# Patient Record
Sex: Female | Born: 1965 | Race: Black or African American | Hispanic: No | Smoking: Former smoker
Health system: Southern US, Community
[De-identification: ages and names within clinical notes are randomized; demographics above are authoritative.]

## PROBLEM LIST (undated history)

## (undated) DIAGNOSIS — M1990 Unspecified osteoarthritis, unspecified site: Secondary | ICD-10-CM

## (undated) DIAGNOSIS — G8929 Other chronic pain: Secondary | ICD-10-CM

## (undated) DIAGNOSIS — R519 Headache, unspecified: Secondary | ICD-10-CM

## (undated) DIAGNOSIS — F329 Major depressive disorder, single episode, unspecified: Secondary | ICD-10-CM

## (undated) DIAGNOSIS — F419 Anxiety disorder, unspecified: Secondary | ICD-10-CM

## (undated) DIAGNOSIS — E079 Disorder of thyroid, unspecified: Secondary | ICD-10-CM

## (undated) DIAGNOSIS — I10 Essential (primary) hypertension: Secondary | ICD-10-CM

## (undated) DIAGNOSIS — M545 Low back pain, unspecified: Secondary | ICD-10-CM

## (undated) DIAGNOSIS — G43909 Migraine, unspecified, not intractable, without status migrainosus: Secondary | ICD-10-CM

## (undated) DIAGNOSIS — M797 Fibromyalgia: Secondary | ICD-10-CM

## (undated) DIAGNOSIS — K219 Gastro-esophageal reflux disease without esophagitis: Secondary | ICD-10-CM

## (undated) DIAGNOSIS — E7800 Pure hypercholesterolemia, unspecified: Secondary | ICD-10-CM

## (undated) DIAGNOSIS — K589 Irritable bowel syndrome without diarrhea: Secondary | ICD-10-CM

## (undated) DIAGNOSIS — F32A Depression, unspecified: Secondary | ICD-10-CM

## (undated) HISTORY — DX: Essential (primary) hypertension: I10

## (undated) HISTORY — DX: Headache, unspecified: R51.9

## (undated) HISTORY — DX: Other chronic pain: G89.29

## (undated) HISTORY — DX: Low back pain: M54.5

## (undated) HISTORY — DX: Low back pain, unspecified: M54.50

## (undated) HISTORY — DX: Irritable bowel syndrome, unspecified: K58.9

## (undated) HISTORY — DX: Migraine, unspecified, not intractable, without status migrainosus: G43.909

## (undated) HISTORY — DX: Unspecified osteoarthritis, unspecified site: M19.90

## (undated) HISTORY — DX: Disorder of thyroid, unspecified: E07.9

---
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
## 2005-02-28 HISTORY — PX: ESOPHAGOGASTRODUODENOSCOPY: SHX1529

## 2006-05-30 HISTORY — PX: COLONOSCOPY: SHX174

## 2008-02-29 HISTORY — PX: CHOLECYSTECTOMY: SHX55

## 2009-06-08 DIAGNOSIS — F419 Anxiety disorder, unspecified: Secondary | ICD-10-CM | POA: Insufficient documentation

## 2009-06-08 DIAGNOSIS — E559 Vitamin D deficiency, unspecified: Secondary | ICD-10-CM | POA: Insufficient documentation

## 2009-10-29 DIAGNOSIS — L0390 Cellulitis, unspecified: Secondary | ICD-10-CM | POA: Insufficient documentation

## 2009-10-29 HISTORY — DX: Cellulitis, unspecified: L03.90

## 2010-04-29 ENCOUNTER — Ambulatory Visit: Payer: Self-pay | Admitting: Family Medicine

## 2010-09-29 HISTORY — PX: ESOPHAGOGASTRODUODENOSCOPY: SHX1529

## 2010-10-14 DIAGNOSIS — K449 Diaphragmatic hernia without obstruction or gangrene: Secondary | ICD-10-CM | POA: Insufficient documentation

## 2010-10-14 HISTORY — DX: Diaphragmatic hernia without obstruction or gangrene: K44.9

## 2011-04-25 ENCOUNTER — Other Ambulatory Visit: Payer: Self-pay | Admitting: Internal Medicine

## 2011-04-25 DIAGNOSIS — M545 Low back pain: Secondary | ICD-10-CM

## 2011-05-01 ENCOUNTER — Ambulatory Visit
Admission: RE | Admit: 2011-05-01 | Discharge: 2011-05-01 | Disposition: A | Discharge status: Home / Self Care | Payer: BC Managed Care – HMO | Source: Ambulatory Visit | Attending: Internal Medicine | Admitting: Internal Medicine

## 2011-05-01 DIAGNOSIS — M545 Low back pain: Secondary | ICD-10-CM

## 2011-10-14 DIAGNOSIS — K589 Irritable bowel syndrome without diarrhea: Secondary | ICD-10-CM | POA: Insufficient documentation

## 2012-02-29 HISTORY — PX: COLONOSCOPY: SHX174

## 2012-03-11 ENCOUNTER — Emergency Department (HOSPITAL_COMMUNITY)
Admission: EM | Admit: 2012-03-11 | Discharge: 2012-03-11 | Disposition: A | Discharge status: Home / Self Care | Payer: BC Managed Care – PPO | Attending: Emergency Medicine | Admitting: Emergency Medicine

## 2012-03-11 ENCOUNTER — Encounter (HOSPITAL_COMMUNITY): Payer: Self-pay | Admitting: *Deleted

## 2012-03-11 ENCOUNTER — Emergency Department (HOSPITAL_COMMUNITY): Payer: BC Managed Care – PPO

## 2012-03-11 DIAGNOSIS — F3289 Other specified depressive episodes: Secondary | ICD-10-CM | POA: Insufficient documentation

## 2012-03-11 DIAGNOSIS — Z79899 Other long term (current) drug therapy: Secondary | ICD-10-CM | POA: Insufficient documentation

## 2012-03-11 DIAGNOSIS — E7800 Pure hypercholesterolemia, unspecified: Secondary | ICD-10-CM | POA: Insufficient documentation

## 2012-03-11 DIAGNOSIS — R0789 Other chest pain: Secondary | ICD-10-CM

## 2012-03-11 DIAGNOSIS — F411 Generalized anxiety disorder: Secondary | ICD-10-CM | POA: Insufficient documentation

## 2012-03-11 DIAGNOSIS — K219 Gastro-esophageal reflux disease without esophagitis: Secondary | ICD-10-CM | POA: Insufficient documentation

## 2012-03-11 DIAGNOSIS — F329 Major depressive disorder, single episode, unspecified: Secondary | ICD-10-CM | POA: Insufficient documentation

## 2012-03-11 DIAGNOSIS — IMO0001 Reserved for inherently not codable concepts without codable children: Secondary | ICD-10-CM | POA: Insufficient documentation

## 2012-03-11 DIAGNOSIS — R071 Chest pain on breathing: Secondary | ICD-10-CM | POA: Insufficient documentation

## 2012-03-11 DIAGNOSIS — R42 Dizziness and giddiness: Secondary | ICD-10-CM | POA: Insufficient documentation

## 2012-03-11 HISTORY — DX: Depression, unspecified: F32.A

## 2012-03-11 HISTORY — DX: Gastro-esophageal reflux disease without esophagitis: K21.9

## 2012-03-11 HISTORY — DX: Anxiety disorder, unspecified: F41.9

## 2012-03-11 HISTORY — DX: Pure hypercholesterolemia, unspecified: E78.00

## 2012-03-11 HISTORY — DX: Fibromyalgia: M79.7

## 2012-03-11 HISTORY — DX: Major depressive disorder, single episode, unspecified: F32.9

## 2012-03-11 LAB — CBC
MCHC: 34 g/dL (ref 30.0–36.0)
Platelets: 304 10*3/uL (ref 150–400)
RDW: 12.7 % (ref 11.5–15.5)
WBC: 5.4 10*3/uL (ref 4.0–10.5)

## 2012-03-11 LAB — BASIC METABOLIC PANEL
Chloride: 107 mEq/L (ref 96–112)
Creatinine, Ser: 0.78 mg/dL (ref 0.50–1.10)
GFR calc Af Amer: >90 mL/min (ref >90)
GFR calc non Af Amer: >90 mL/min (ref >90)
Potassium: 3.7 mEq/L (ref 3.5–5.1)

## 2012-03-11 LAB — POCT I-STAT TROPONIN I: Troponin i, poc: 0 ng/mL (ref 0.00–0.08)

## 2012-03-11 NOTE — ED Notes (Signed)
Pt verbalized understanding of importance to follow up for outpatient stress test.

## 2012-03-11 NOTE — ED Provider Notes (Signed)
History     CSN: [id number]  Arrival date & time 03/11/12  1453   First MD Initiated Contact with Patient 03/11/12 1608      Chief Complaint  Patient presents with  . Chest Pain    (Consider location/radiation/quality/duration/timing/severity/associated sxs/prior treatment) HPI Comments: Patient presents with intermittent chest pain for the past 3-4 weeks.  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  She reports that the pain is not associated with any other type of exertion.  The pain came on again last evening while at work.  The pain has been constant since that time.  Patient has seen her PCP for this pain.  PCP thought that the pain was musculoskeletal.  She reports that she has been having mild shortness of breath associated with the pain.  She denies nausea, vomiting, or diaphoresis.  She does not have a prior cardiac history.  She does have a history of "pre diabetes", Hyperlipidemia, and is overweight.  No history of HTN.  No FH of early cardiac disease.  She reports that she has never had a stress test or an Echocardiogram.  She denies history of DVT/PE, lower extremity edema or pain, denies prolonged travel or surgeries in the past 4 weeks.  She is currently not on any estrogen containing medications.  No prior history of Cancer.  The history is provided by the patient.    Past Medical History  Diagnosis Date  . Fibromyalgia   . High cholesterol   . Acid reflux   . Depression   . Anxiety     History reviewed. No pertinent past surgical history.  History reviewed. No pertinent family history.  History  Substance Use Topics  . Smoking status: Not on file  . Smokeless tobacco: Not on file  . Alcohol Use: No    OB History    Grav Para Term Preterm Abortions TAB SAB Ect Mult Living                  Review of Systems  Constitutional: Negative for fever, chills and diaphoresis.  HENT: Negative for neck pain and neck  stiffness.   Respiratory: Negative for cough, shortness of breath and wheezing.   Cardiovascular: Positive for chest pain. Negative for palpitations and leg swelling.  Gastrointestinal: Negative for nausea, vomiting and abdominal pain.  Skin: Negative for rash.  Neurological: Positive for light-headedness. Negative for syncope, weakness and headaches.  All other systems reviewed and are negative.    Allergies  Macrodantin and Latex  Home Medications   Current Outpatient Rx  Name  Route  Sig  Dispense  Refill  . AMITRIPTYLINE HCL 25 MG PO TABS   Oral   Take 25 mg by mouth at bedtime.         Marland Kitchen ESCITALOPRAM OXALATE 5 MG PO TABS   Oral   Take 5 mg by mouth daily.         Marland Kitchen HYDROCODONE-ACETAMINOPHEN 5-325 MG PO TABS   Oral   Take 1 tablet by mouth every 4 (four) hours as needed. For pain         . TRAMADOL HCL 50 MG PO TABS   Oral   Take 50 mg by mouth 3 (three) times daily as needed. For pain         . VITAMIN D (ERGOCALCIFEROL) 50000 UNITS PO CAPS   Oral   Take 50,000 Units by mouth every 7 (seven) days. Does not  take on a specific day of the week           BP 139/92  Pulse 76  Temp 98.7 F (37.1 C) (Oral)  Resp 18  SpO2 100%  Physical Exam  Nursing note and vitals reviewed. Constitutional: She appears well-developed and well-nourished. No distress.  HENT:  Head: Normocephalic and atraumatic.  Mouth/Throat: Oropharynx is clear and moist.  Neck: Normal range of motion. Neck supple.  Cardiovascular: Normal rate, regular rhythm, normal heart sounds and intact distal pulses.   No murmur heard. Pulmonary/Chest: Effort normal and breath sounds normal. No respiratory distress. She has no wheezes. She has no rales. She exhibits tenderness.       Chest wall tender to palpation  Abdominal: Soft. There is no tenderness.  Musculoskeletal: Normal range of motion. She exhibits no edema.       No LE edema Negative Homan's sign bilaterally  Neurological: She is  alert.  Skin: Skin is warm and dry. No rash noted. She is not diaphoretic.  Psychiatric: She has a normal mood and affect.    ED Course  Procedures (including critical care time)  Labs Reviewed  BASIC METABOLIC PANEL - Abnormal; Notable for the following:    Glucose, Bld 103 (*)     All other components within normal limits  CBC  POCT I-STAT TROPONIN I   Dg Chest 2 View  03/11/2012  *RADIOLOGY REPORT*  Clinical Data: Left-sided chest pain.  Shortness of breath.  CHEST - 2 VIEW  Comparison:  None.  Findings:  The heart size and mediastinal contours are within normal limits.  Both lungs are clear.  The visualized skeletal structures are unremarkable.  IMPRESSION: No active cardiopulmonary disease.   Original Report Authenticated By: Myles Rosenthal, M.D.      No diagnosis found.    Date: 03/12/2012  Rate: 77  Rhythm: normal sinus rhythm and sinus arrhythmia  QRS Axis: left  Intervals: normal  ST/T Wave abnormalities: nonspecific T wave changes  Conduction Disutrbances:none  Narrative Interpretation:   Old EKG Reviewed: none available   Patient discussed with Dr. Denton Lank.  MDM  Patient is to be discharged with recommendation to follow up with PCP in regards to today's hospital visit. Patient also given referral to Cardiology to obtain an outpatient stress test.  Chest pain is not likely of cardiac or pulmonary etiology d/t presentation, PERC negative, VSS, Heart  RRR, Lungs clear to ausculation bilaterally, EKG without ischemic changes, negative troponin, and negative CXR. Chest wall tender to palpation.  Suspect that the pain is musculoskeletal.  Return precautions discussed with patient.  Patient in agreement with the plan.  Case has been discussed with and seen by Dr. Denton Lank who agrees with the above plan to discharge.         Pascal Lux [place], PA-C 03/12/12 1726

## 2012-03-11 NOTE — ED Notes (Signed)
Pt reports having left side chest and shoulder pain intermittent x 2 weeks. Having tingling to bilateral hands. Reports going to pcp for it and had ekg done and given shot for arthritis but no relief. Now also having increase in fatigue and dizziness, ekg done at triage.

## 2012-03-13 ENCOUNTER — Other Ambulatory Visit (HOSPITAL_COMMUNITY): Payer: Self-pay | Admitting: Cardiology

## 2012-03-13 DIAGNOSIS — R079 Chest pain, unspecified: Secondary | ICD-10-CM

## 2012-03-13 NOTE — ED Provider Notes (Signed)
Medical screening examination/treatment/procedure(s) were performed by non-physician practitioner and as supervising physician I was immediately available for consultation/collaboration.   Suzi Roots, MD 03/13/12 [phone]

## 2012-03-23 ENCOUNTER — Encounter (HOSPITAL_COMMUNITY): Payer: BC Managed Care – PPO

## 2012-04-02 ENCOUNTER — Other Ambulatory Visit: Payer: Self-pay

## 2012-04-02 ENCOUNTER — Encounter (HOSPITAL_COMMUNITY)
Admission: RE | Admit: 2012-04-02 | Discharge: 2012-04-02 | Disposition: A | Discharge status: Home / Self Care | Payer: BC Managed Care – PPO | Source: Ambulatory Visit | Attending: Cardiology | Admitting: Cardiology

## 2012-04-02 DIAGNOSIS — R079 Chest pain, unspecified: Secondary | ICD-10-CM | POA: Insufficient documentation

## 2012-04-02 MED ORDER — TECHNETIUM TC 99M SESTAMIBI GENERIC - CARDIOLITE
10.0000 | Freq: Once | INTRAVENOUS | Status: AC | PRN
  Administered 2012-04-02: 10 via INTRAVENOUS

## 2012-04-02 MED ORDER — REGADENOSON 0.4 MG/5ML IV SOLN
0.4000 mg | Freq: Once | INTRAVENOUS | Status: AC
  Administered 2012-04-02: 0.4 mg via INTRAVENOUS

## 2012-04-02 MED ORDER — REGADENOSON 0.4 MG/5ML IV SOLN: 0.4000 mg | Freq: Once | INTRAVENOUS | Status: DC

## 2012-04-02 MED ORDER — REGADENOSON 0.4 MG/5ML IV SOLN
INTRAVENOUS | Status: AC
  Administered 2012-04-02: 0.4 mg via INTRAVENOUS
  Filled 2012-04-02: qty 5

## 2012-04-02 MED ORDER — TECHNETIUM TC 99M SESTAMIBI GENERIC - CARDIOLITE
30.0000 | Freq: Once | INTRAVENOUS | Status: AC | PRN
  Administered 2012-04-02: 30 via INTRAVENOUS

## 2012-10-23 ENCOUNTER — Other Ambulatory Visit: Payer: Self-pay | Admitting: Internal Medicine

## 2012-10-23 DIAGNOSIS — M545 Low back pain: Secondary | ICD-10-CM

## 2012-10-23 DIAGNOSIS — S39012A Strain of muscle, fascia and tendon of lower back, initial encounter: Secondary | ICD-10-CM

## 2012-10-31 ENCOUNTER — Ambulatory Visit
Admission: RE | Admit: 2012-10-31 | Discharge: 2012-10-31 | Disposition: A | Discharge status: Home / Self Care | Payer: BC Managed Care – PPO | Source: Ambulatory Visit | Attending: Internal Medicine | Admitting: Internal Medicine

## 2012-10-31 DIAGNOSIS — M545 Low back pain: Secondary | ICD-10-CM

## 2012-10-31 DIAGNOSIS — S39012A Strain of muscle, fascia and tendon of lower back, initial encounter: Secondary | ICD-10-CM

## 2012-12-19 ENCOUNTER — Other Ambulatory Visit: Payer: Self-pay | Admitting: Obstetrics and Gynecology

## 2012-12-19 DIAGNOSIS — R102 Pelvic and perineal pain: Secondary | ICD-10-CM

## 2012-12-21 ENCOUNTER — Ambulatory Visit
Admission: RE | Admit: 2012-12-21 | Discharge: 2012-12-21 | Disposition: A | Discharge status: Home / Self Care | Payer: BC Managed Care – PPO | Source: Ambulatory Visit | Attending: Obstetrics and Gynecology | Admitting: Obstetrics and Gynecology

## 2012-12-21 DIAGNOSIS — R102 Pelvic and perineal pain: Secondary | ICD-10-CM

## 2012-12-21 MED ORDER — GADOBENATE DIMEGLUMINE 529 MG/ML IV SOLN
20.0000 mL | Freq: Once | INTRAVENOUS | Status: AC | PRN
  Administered 2012-12-21: 20 mL via INTRAVENOUS

## 2013-11-08 HISTORY — PX: ESOPHAGOGASTRODUODENOSCOPY: SHX1529

## 2014-07-10 DIAGNOSIS — Z9049 Acquired absence of other specified parts of digestive tract: Secondary | ICD-10-CM

## 2014-07-10 HISTORY — DX: Acquired absence of other specified parts of digestive tract: Z90.49

## 2014-11-10 ENCOUNTER — Other Ambulatory Visit: Payer: Self-pay | Admitting: Neurosurgery

## 2014-11-10 DIAGNOSIS — M5416 Radiculopathy, lumbar region: Secondary | ICD-10-CM

## 2014-12-06 ENCOUNTER — Other Ambulatory Visit: Payer: Self-pay

## 2014-12-11 ENCOUNTER — Ambulatory Visit
Admission: RE | Admit: 2014-12-11 | Discharge: 2014-12-11 | Disposition: A | Discharge status: Home / Self Care | Payer: BLUE CROSS/BLUE SHIELD | Source: Ambulatory Visit | Attending: Neurosurgery | Admitting: Neurosurgery

## 2014-12-11 DIAGNOSIS — M5416 Radiculopathy, lumbar region: Secondary | ICD-10-CM

## 2015-06-02 DIAGNOSIS — E785 Hyperlipidemia, unspecified: Secondary | ICD-10-CM | POA: Insufficient documentation

## 2015-06-02 DIAGNOSIS — R7303 Prediabetes: Secondary | ICD-10-CM | POA: Insufficient documentation

## 2016-01-01 ENCOUNTER — Telehealth: Payer: Self-pay | Admitting: Rheumatology

## 2016-01-01 NOTE — Telephone Encounter (Signed)
Patient would like to know the status of FMLA paperwork that was faxed over last week.

## 2016-01-01 NOTE — Telephone Encounter (Signed)
It is in my box, I will work on disability forms when I have completed all the medication requests in my box will you let her know I am behind if she calls back

## 2016-05-16 ENCOUNTER — Encounter: Payer: Self-pay | Admitting: Rheumatology

## 2016-05-16 ENCOUNTER — Ambulatory Visit (INDEPENDENT_AMBULATORY_CARE_PROVIDER_SITE_OTHER): Payer: BLUE CROSS/BLUE SHIELD | Admitting: Rheumatology

## 2016-05-16 VITALS — BP 130/80 | HR 88 | Resp 14 | Ht 63.0 in | Wt 214.0 lb

## 2016-05-16 DIAGNOSIS — R5382 Chronic fatigue, unspecified: Secondary | ICD-10-CM | POA: Diagnosis not present

## 2016-05-16 DIAGNOSIS — M797 Fibromyalgia: Secondary | ICD-10-CM | POA: Diagnosis not present

## 2016-05-16 DIAGNOSIS — F5101 Primary insomnia: Secondary | ICD-10-CM

## 2016-05-16 DIAGNOSIS — M62838 Other muscle spasm: Secondary | ICD-10-CM

## 2016-05-16 NOTE — Progress Notes (Signed)
Office Visit Note  Patient: Sandy Waters             Date of Birth: [birth year]           MRN: [id number]             PCP: HAMA AMIN, ALI M., MD Referring: Babs Bertin, Odette Fraction., MD Visit Date: 05/16/2016 Occupation: @GUAROCC @    Subjective:  Pain of the Right Shoulder; Pain of the Left Shoulder; Pain of the Left Hand; Pain of the Right Hand; Pain of the Neck; and Follow-up   History of Present Illness: Sandy Waters is a 51 y.o. female  Last seen 11/26/2015  Patient is having a flare for fibromyalgia. Specifically bilateral trapezius muscles are painful. Her last injection was on 11/26/2015 and she did really well until recently.  She has ongoing fatigue and insomnia.  She also complains of having bilateral hands and feet with some amount of swelling. She also states that when she elevates her legs her swelling decreases in her feet. I described that she's probably having peripheral edema which she will need to discuss with her PCP and she is agreeable.  Her swelling of her hands is more consistent with osteoarthritis. We discussed OA supplements which the patient is ready to try.   Activities of Daily Living:  Patient reports morning stiffness for 30 minutes.   Patient Denies nocturnal pain.  Difficulty dressing/grooming: Denies Difficulty climbing stairs: Denies Difficulty getting out of chair: Denies Difficulty using hands for taps, buttons, cutlery, and/or writing: Denies   No Rheumatology ROS completed.   PMFS History:  There are no active problems to display for this patient.   Past Medical History:  Diagnosis Date  . Acid reflux   . Anxiety   . Depression   . Fibromyalgia   . High cholesterol     No family history on file. Past Surgical History:  Procedure Laterality Date  . CESAREAN SECTION    . CHOLECYSTECTOMY    . TONSILLECTOMY     Social History   Social History Narrative  . No narrative on file     Objective: Vital Signs: BP 130/80    Pulse 88   Resp 14   Ht 5' 3"  (1.6 m)   Wt 214 lb (97.1 kg)   BMI 37.91 kg/m    Physical Exam   Musculoskeletal Exam:  Full range of motion of all joints Grip strength is equal and strong bilaterally Fibromyalgia tender points are all present (18 out of 18  CDAI Exam: No CDAI exam completed.  No synovitis on exam  Investigation: No additional findings. No visits with results within 6 Month(s) from this visit.  Latest known visit with results is:  Admission on 03/11/2012, Discharged on 03/11/2012  Component Date Value Ref Range Status  . WBC 03/11/2012 5.4  4.0 - 10.5 K/uL Final  . RBC 03/11/2012 4.81  3.87 - 5.11 MIL/uL Final  . Hemoglobin 03/11/2012 13.9  12.0 - 15.0 g/dL Final  . HCT 03/11/2012 40.9  36.0 - 46.0 % Final  . MCV 03/11/2012 85.0  78.0 - 100.0 fL Final  . MCH 03/11/2012 28.9  26.0 - 34.0 pg Final  . MCHC 03/11/2012 34.0  30.0 - 36.0 g/dL Final  . RDW 03/11/2012 12.7  11.5 - 15.5 % Final  . Platelets 03/11/2012 304  150 - 400 K/uL Final  . Sodium 03/11/2012 142  135 - 145 mEq/L Final  . Potassium 03/11/2012 3.7  3.5 -  5.1 mEq/L Final  . Chloride 03/11/2012 107  96 - 112 mEq/L Final  . CO2 03/11/2012 23  19 - 32 mEq/L Final  . Glucose, Bld 03/11/2012 103* 70 - 99 mg/dL Final  . BUN 03/11/2012 9  6 - 23 mg/dL Final  . Creatinine, Ser 03/11/2012 0.78  0.50 - 1.10 mg/dL Final  . Calcium 03/11/2012 9.8  8.4 - 10.5 mg/dL Final  . GFR calc non Af Amer 03/11/2012 >90  >90 mL/min Final  . GFR calc Af Amer 03/11/2012 >90  >90 mL/min Final   Comment:                                 The eGFR has been calculated                          using the CKD EPI equation.                          This calculation has not been                          validated in all clinical                          situations.                          eGFR's persistently                          <90 mL/min signify                          possible Chronic Kidney Disease.  . Troponin  i, poc 03/11/2012 0.00  0.00 - 0.08 ng/mL Final  . Comment 3 03/11/2012          Final   Comment: Due to the release kinetics of cTnI,                          a negative result within the first hours                          of the onset of symptoms does not rule out                          myocardial infarction with certainty.                          If myocardial infarction is still suspected,                          repeat the test at appropriate intervals.     Imaging: No results found.  Speciality Comments: No specialty comments available.    Procedures:  No procedures performed Allergies: Macrodantin [nitrofurantoin macrocrystal]; Sulfa antibiotics; and Latex   Assessment / Plan:     Visit Diagnoses: Fibromyalgia  Chronic fatigue  Primary insomnia  Trapezius muscle spasm - 05/16/2016: Injected bilateral trapezius muscles with 10 mg of Kenalog mixed with 0.3 and also 1%  lidocaine without complication   Plan: #1: Fibromyalgia. Active disease with joint pain and 18 out of 18 tender points #2: Ongoing fatigue and insomnia. #3: Bilateral trapezius muscle spasms (bilateral aspects are injected. See procedure full details #4: Bilateral hand with mild swelling. Consistent with osteoarthritis. DIP PIP prominence bilaterally Discuss OA supplements. Patient can take all 4 of the supplements if necessary but she should start with 1 and advance as tolerated if needed #5: Bilateral feet with some mild swelling. Consistent with peripheral edema. Advised the patient for weight loss and to discuss with PCP Orders: No orders of the defined types were placed in this encounter.  No orders of the defined types were placed in this encounter.   Face-to-face time spent with patient was 30 minutes. 50% of time was spent in counseling and coordination of care.  Follow-Up Instructions: Return in about 6 months (around 11/16/2016) for FMS, FATIGUE,INSOMNIA, bil trap muscle  spasm.   Eliezer Lofts, PA-C  Note - This record has been created using [employer].  Chart creation errors have been sought, but may not always  have been located. Such creation errors do not reflect on  the standard of medical care.

## 2016-08-30 HISTORY — PX: SHOULDER SURGERY: SHX246

## 2016-10-21 ENCOUNTER — Encounter: Payer: Self-pay | Admitting: Rheumatology

## 2016-10-21 ENCOUNTER — Ambulatory Visit (INDEPENDENT_AMBULATORY_CARE_PROVIDER_SITE_OTHER): Payer: BLUE CROSS/BLUE SHIELD | Admitting: Rheumatology

## 2016-10-21 VITALS — BP 124/83 | HR 68 | Resp 14 | Ht 63.0 in | Wt 211.0 lb

## 2016-10-21 DIAGNOSIS — R5382 Chronic fatigue, unspecified: Secondary | ICD-10-CM | POA: Diagnosis not present

## 2016-10-21 DIAGNOSIS — M533 Sacrococcygeal disorders, not elsewhere classified: Secondary | ICD-10-CM | POA: Diagnosis not present

## 2016-10-21 DIAGNOSIS — F5101 Primary insomnia: Secondary | ICD-10-CM

## 2016-10-21 DIAGNOSIS — M62838 Other muscle spasm: Secondary | ICD-10-CM

## 2016-10-21 DIAGNOSIS — M797 Fibromyalgia: Secondary | ICD-10-CM

## 2016-10-21 MED ORDER — DICLOFENAC SODIUM 1 % TD GEL: TRANSDERMAL | 3 refills | Status: DC

## 2016-10-21 MED ORDER — TRIAMCINOLONE ACETONIDE 40 MG/ML IJ SUSP
40.0000 mg | INTRAMUSCULAR | Status: AC | PRN
  Administered 2016-10-21: 40 mg via INTRA_ARTICULAR

## 2016-10-21 MED ORDER — LIDOCAINE HCL 1 % IJ SOLN
1.0000 mL | INTRAMUSCULAR | Status: AC | PRN
Start: 2016-10-21 — End: 2016-10-21
  Administered 2016-10-21: 1 mL

## 2016-10-21 NOTE — Progress Notes (Signed)
 Office Visit Note  Patient: Sandy Waters             Date of Birth: 08/13/1965           MRN: [id number]             PCP: Hama Amin, Ali M., MD Referring: Hama Amin, Ali M., MD Visit Date: 10/21/2016 Occupation: @GUAROCC@    Subjective:  No chief complaint on file.   History of Present Illness: Sandy Waters is a 51 y.o. female  Was last seen in our office on 05/16/2016 for fibromyalgia.   Today, patient reports that she is having a flare of her fibromyalgia. Her right SI joint is extremely painful and she rates the pain as 5-7 on a scale of 0-10. Patient would like to get a cortisone injection.  She has ongoing fatigue and insomnia.  She also has been diagnosed with spondylolisthesis. She sees a pain management doctor who is part of the orthopedic group, Dr. Bailey, in [place]. He's been treating her with Celebrex. Patient is requesting a refill on tramadol. I've asked her to get that medication from Dr. Bailey's office.  Patient also reports that because of her current back pain and issues, she is unable to work at her regular job doing her regular things. She has been told by her pain doctor to modify her work so she's not doing any heavy lifting. She also is applying for disability secondary to her back pain.  In addition, patient wants to have weight loss.   Activities of Daily Living:  Patient reports morning stiffness for 30 minutes.   Patient Reports nocturnal pain.  Difficulty dressing/grooming: Denies Difficulty climbing stairs: Reports Difficulty getting out of chair: Reports Difficulty using hands for taps, buttons, cutlery, and/or writing: Denies   Review of Systems  Constitutional: Positive for fatigue.  HENT: Negative for mouth sores and mouth dryness.   Eyes: Negative for dryness.  Respiratory: Negative for shortness of breath.   Gastrointestinal: Negative for constipation and diarrhea.  Musculoskeletal: Positive for myalgias and  myalgias.  Skin: Negative for sensitivity to sunlight.  Psychiatric/Behavioral: Positive for sleep disturbance. Negative for decreased concentration.    PMFS History:  There are no active problems to display for this patient.   Past Medical History:  Diagnosis Date  . Acid reflux   . Anxiety   . Depression   . Fibromyalgia   . High cholesterol     History reviewed. No pertinent family history. Past Surgical History:  Procedure Laterality Date  . CESAREAN SECTION    . CHOLECYSTECTOMY    . TONSILLECTOMY     Social History   Social History Narrative  . No narrative on file     Objective: Vital Signs: BP 124/83   Pulse 68   Resp 14   Ht 5' 3" (1.6 m)   Wt 211 lb (95.7 kg)   BMI 37.38 kg/m    Physical Exam  Constitutional: She is oriented to person, place, and time. She appears well-developed and well-nourished.  HENT:  Head: Normocephalic and atraumatic.  Eyes: Pupils are equal, round, and reactive to light. EOM are normal.  Cardiovascular: Normal rate, regular rhythm and normal heart sounds.  Exam reveals no gallop and no friction rub.   No murmur heard. Pulmonary/Chest: Effort normal and breath sounds normal. She has no wheezes. She has no rales.  Abdominal: Soft. Bowel sounds are normal. She exhibits no distension. There is no tenderness. There is no guarding.   No hernia.  Musculoskeletal: Normal range of motion. She exhibits no edema, tenderness or deformity.  Lymphadenopathy:    She has no cervical adenopathy.  Neurological: She is alert and oriented to person, place, and time. Coordination normal.  Skin: Skin is warm and dry. Capillary refill takes less than 2 seconds. No rash noted.  Psychiatric: She has a normal mood and affect. Her behavior is normal.  Nursing note and vitals reviewed.    Musculoskeletal Exam:  Full range of motion of all joints Grip strength is equal and strong bilaterally Fiber myalgia tender points are 6 out of 18 positive And  especially tender is the right SI joint (she wants a cortisone injection today) Also bilateral trapezius muscles and bilateral greater trochanteric bursa  CDAI Exam: CDAI Homunculus Exam:   Joint Counts:  CDAI Tender Joint count: 0 CDAI Swollen Joint count: 0  Global Assessments:  Patient Global Assessment: 7 Provider Global Assessment: 7  CDAI Calculated Score: 14  No synovitis on exam  Investigation: No additional findings. No visits with results within 6 Month(s) from this visit.  Latest known visit with results is:  Admission on 03/11/2012, Discharged on 03/11/2012  Component Date Value Ref Range Status  . WBC 03/11/2012 5.4  4.0 - 10.5 K/uL Final  . RBC 03/11/2012 4.81  3.87 - 5.11 MIL/uL Final  . Hemoglobin 03/11/2012 13.9  12.0 - 15.0 g/dL Final  . HCT 03/11/2012 40.9  36.0 - 46.0 % Final  . MCV 03/11/2012 85.0  78.0 - 100.0 fL Final  . MCH 03/11/2012 28.9  26.0 - 34.0 pg Final  . MCHC 03/11/2012 34.0  30.0 - 36.0 g/dL Final  . RDW 03/11/2012 12.7  11.5 - 15.5 % Final  . Platelets 03/11/2012 304  150 - 400 K/uL Final  . Sodium 03/11/2012 142  135 - 145 mEq/L Final  . Potassium 03/11/2012 3.7  3.5 - 5.1 mEq/L Final  . Chloride 03/11/2012 107  96 - 112 mEq/L Final  . CO2 03/11/2012 23  19 - 32 mEq/L Final  . Glucose, Bld 03/11/2012 103* 70 - 99 mg/dL Final  . BUN 03/11/2012 9  6 - 23 mg/dL Final  . Creatinine, Ser 03/11/2012 0.78  0.50 - 1.10 mg/dL Final  . Calcium 03/11/2012 9.8  8.4 - 10.5 mg/dL Final  . GFR calc non Af Amer 03/11/2012 >90  >90 mL/min Final  . GFR calc Af Amer 03/11/2012 >90  >90 mL/min Final   Comment:                                 The eGFR has been calculated                          using the CKD EPI equation.                          This calculation has not been                          validated in all clinical                          situations.  eGFR's persistently                          <90 mL/min signify                            possible Chronic Kidney Disease.  . Troponin i, poc 03/11/2012 0.00  0.00 - 0.08 ng/mL Final  . Comment 3 03/11/2012          Final   Comment: Due to the release kinetics of cTnI,                          a negative result within the first hours                          of the onset of symptoms does not rule out                          myocardial infarction with certainty.                          If myocardial infarction is still suspected,                          repeat the test at appropriate intervals.     Imaging: No results found.  Speciality Comments: No specialty comments available.    Procedures:  Large Joint Inj Date/Time: 10/21/2016 4:46 PM Performed by: Eliezer Lofts Authorized by: Eliezer Lofts   Consent Given by:  Patient Site marked: the procedure site was marked   Timeout: prior to procedure the correct patient, procedure, and site was verified   Indications:  Pain Location:  Sacroiliac Site:  R sacroiliac joint Prep: patient was prepped and draped in usual sterile fashion   Needle Size:  27 G Needle Length:  1.5 inches Approach:  Superior Ultrasound Guidance: No   Fluoroscopic Guidance: No   Arthrogram: No   Medications:  1 mL lidocaine 1 %; 40 mg triamcinolone acetonide 40 MG/ML Aspiration Attempted: Yes   Aspirate amount (mL):  0 Patient tolerance:  Patient tolerated the procedure well with no immediate complications   Allergies: Macrodantin [nitrofurantoin macrocrystal]; Sulfa antibiotics; and Latex   Assessment / Plan:     Visit Diagnoses: Fibromyalgia  Chronic fatigue  Primary insomnia  Trapezius muscle spasm  Sacroiliac joint pain   Plan: #1: Fibromyalgia. Active disease with toys pain and 6 out of 18 tender points with exquisite pain to the right SI joint today.  #2: Right SI joint pain. After informed consent was obtained, the site was prepped in usual sterile fashion, and injected with 40 mg of  Kenalog mixed with one half mL's 1% lidocaine. Patient tolerated procedure well. There no complications  #3: Fatigue and insomnia.  #4: Low back pain secondary to spondylolisthesis. Patient is being managed by her pain management doctor for this in [place].  #5: Due to the above diagnosis of spondylolisthesis, patient is seeking disability.  #6: Return to clinic in 5 months  Orders: Orders Placed This Encounter  Procedures  . Large Joint Injection/Arthrocentesis   Meds ordered this encounter  Medications  . DISCONTD: diclofenac sodium (VOLTAREN) 1 % GEL    Sig: Voltaren Gel 3 grams to 3 large joints upto  TID 10 TUBES with 3 refills    Dispense:  10 Tube    Refill:  3    Voltaren Gel 3 grams to 3 large joints upto TID 10 TUBES with 3 refills    Order Specific Question:   Supervising Provider    Answer:   Bo Merino [[id number]]  . diclofenac sodium (VOLTAREN) 1 % GEL    Sig: Voltaren Gel 3 grams to 3 large joints upto TID 10 TUBES with 3 refills    Dispense:  10 Tube    Refill:  3    Voltaren Gel 3 grams to 3 large joints upto TID 10 TUBES with 3 refills    Order Specific Question:   Supervising Provider    Answer:   Bo Merino [phone]    Face-to-face time spent with patient was 30 minutes. 50% of time was spent in counseling and coordination of care.  Follow-Up Instructions: Return in about 5 months (around 03/23/2017) for FMS, FATIGUE,INSOMNIA,rt si jt pain,.   Eliezer Lofts, PA-C  Note - This record has been created using [employer].  Chart creation errors have been sought, but may not always  have been located. Such creation errors do not reflect on  the standard of medical care.

## 2016-11-17 ENCOUNTER — Ambulatory Visit: Payer: BLUE CROSS/BLUE SHIELD | Admitting: Rheumatology

## 2017-02-28 DIAGNOSIS — K589 Irritable bowel syndrome without diarrhea: Secondary | ICD-10-CM

## 2017-02-28 DIAGNOSIS — G43909 Migraine, unspecified, not intractable, without status migrainosus: Secondary | ICD-10-CM

## 2017-02-28 HISTORY — DX: Migraine, unspecified, not intractable, without status migrainosus: G43.909

## 2017-02-28 HISTORY — DX: Irritable bowel syndrome, unspecified: K58.9

## 2017-03-07 NOTE — Progress Notes (Signed)
Office Visit Note  Patient: Sandy Waters             Date of Birth: 1966-02-17           MRN: [id number]             PCP: Ignatius Specking, MD Referring: Paschal Dopp, Sheliah Plane., MD Visit Date: 03/20/2017 Occupation: @GUAROCC @    Subjective:  Generalized pain    History of Present Illness: Sandy Waters is a 52 y.o. female with history of fibromyalgia and osteoarthritis.  She states she has been having increased generalized pain.  She has muscle tension and tenderness in the trapezius region and thighs.  She is also having increased fatigue and insomnia.  She uses Voltaren gel, Tramadol, and Mobic PRN.  She would like to try Gabapentin.  She has been having pain and numbness radiating from her left elbow to her left pinky.  She continues to have right shoulder pain.  She had surgery August 30, 2016 with Dr. Rennis Chris to remove bone spurs and scar tissue.   She continues to have pain in her lower back.  She was supposed to have surgery with Dr. Neomia Dear on 02/23/17, but she canceled her surgery.  She is going to reschedule when she feels ready.     Activities of Daily Living:  Patient reports morning stiffness for 1-1.5 hours.   Patient Reports nocturnal pain.  Difficulty dressing/grooming: Denies Difficulty climbing stairs: Denies Difficulty getting out of chair: Denies Difficulty using hands for taps, buttons, cutlery, and/or writing: Reports   Review of Systems  Constitutional: Positive for fatigue. Negative for weakness.  HENT: Positive for mouth dryness. Negative for mouth sores and nose dryness.   Eyes: Positive for dryness. Negative for redness and visual disturbance.  Respiratory: Negative for cough, hemoptysis, shortness of breath and difficulty breathing.   Cardiovascular: Negative for chest pain, palpitations, hypertension, irregular heartbeat and swelling in legs/feet.  Gastrointestinal: Positive for diarrhea (IBS). Negative for blood in stool and constipation.  Endocrine:  Negative for increased urination.  Genitourinary: Negative for painful urination.  Musculoskeletal: Positive for arthralgias, joint pain, myalgias, morning stiffness, muscle tenderness and myalgias. Negative for joint swelling and muscle weakness.  Skin: Negative for color change, pallor, rash, hair loss, nodules/bumps, redness, skin tightness, ulcers and sensitivity to sunlight.  Neurological: Negative for dizziness, numbness and headaches.  Hematological: Negative for swollen glands.  Psychiatric/Behavioral: Positive for sleep disturbance. Negative for depressed mood. The patient is not nervous/anxious.     PMFS History:  There are no active problems to display for this patient.   Past Medical History:  Diagnosis Date  . Acid reflux   . Anxiety   . Depression   . Fibromyalgia   . High cholesterol     Family History  Problem Relation Age of Onset  . Heart disease Mother   . Hypertension Mother   . Hypertension Sister   . Diabetes Sister   . Hypertension Brother   . Schizophrenia Brother   . Diabetes Brother   . Scoliosis Daughter    Past Surgical History:  Procedure Laterality Date  . CESAREAN SECTION    . CHOLECYSTECTOMY    . SHOULDER SURGERY Right 08/30/2016   bone spur/scar tissue removed   . TONSILLECTOMY     Social History   Social History Narrative  . Not on file     Objective: Vital Signs: BP 140/88 (BP Location: Left Arm, Patient Position: Sitting, Cuff Size: Normal)   Pulse  77   Resp 16   Ht 5\' 2"  (1.575 m)   Wt 221 lb (100.2 kg)   BMI 40.42 kg/m    Physical Exam  Constitutional: She is oriented to person, place, and time. She appears well-developed and well-nourished.  HENT:  Head: Normocephalic and atraumatic.  Eyes: Conjunctivae and EOM are normal.  Neck: Normal range of motion.  Cardiovascular: Normal rate, regular rhythm, normal heart sounds and intact distal pulses.  Pulmonary/Chest: Effort normal and breath sounds normal.  Abdominal:  Soft. Bowel sounds are normal.  Lymphadenopathy:    She has no cervical adenopathy.  Neurological: She is alert and oriented to person, place, and time.  Skin: Skin is warm and dry. Capillary refill takes less than 2 seconds.  Psychiatric: She has a normal mood and affect. Her behavior is normal.  Nursing note and vitals reviewed.    Musculoskeletal Exam: C-spine, thoracic, and lumbar spine good ROM.  Shoulder joints, elbow joints, wrist joints, MCPs, PIPs, and DIPs good ROM with no synovitis.  Hip joints, knee joints, ankle joints, MTPs, PIPs, and DIPs good ROM with no synovitis.  She has hyperalgesia on exam.  She has tenderness and muscle tension in trapezius muscle region.  She also has tenderness of right SI joint.    CDAI Exam: No CDAI exam completed.    Investigation: No additional findings.   Imaging: No results found.  Speciality Comments: No specialty comments available.    Procedures:  Trigger Point Inj Date/Time: 03/20/2017 4:37 PM Performed by: Pollyann Savoy, MD Authorized by: Pollyann Savoy, MD   Consent Given by:  Patient Site marked: the procedure site was marked   Timeout: prior to procedure the correct patient, procedure, and site was verified   Indications:  Therapeutic Total # of Trigger Points:  2 Location: neck   Needle Size:  27 G Approach:  Dorsal Medications #1:  0.5 mL lidocaine 1 %; 10 mg triamcinolone acetonide 40 MG/ML Medications #2:  0.5 mL lidocaine 1 %; 10 mg triamcinolone acetonide 40 MG/ML Patient tolerance:  Patient tolerated the procedure well with no immediate complications   Allergies: Macrodantin [nitrofurantoin macrocrystal]; Sulfa antibiotics; and Latex   Assessment / Plan:     Visit Diagnoses: Fibromyalgia: She has been having more frequent fibromyalgia flares.  She continues to take Tramadol and Mobic as needed for pain.  She has increased fatigue and insomnia.  We discussed her trying Melatonin at bedtime for sleep.   We also discussed the importance of exercise.  She is going to try to start water  Aerobics.  She walks on the treadmill a few times a week and feels better after.  She requested bilateral trapezius trigger point injections today.  She tolerated the procedure well.   Neuraigia: She complains of increased tingling and numbness in her extremities. She is also experiencing increase hyperalgesia. She requested a prescription for gabapentin. Indications side effects contraindications were reviewed. She is aware that she should not be driving while taking the medication. She was given a prescription for Gabapentin 300 mg at bedtime.   Primary osteoarthritis of both knees: No effusion or warmth on exam.  She has good ROM.    Numbness and tingling in left arm - ulnar nerve distribution-she has numbness and tingling along the ulnar distribution of her left arm.  A referral was placed for a nerve conduction study. - Plan: Ambulatory referral to Physical Medicine Rehab  Other fatigue: She continues to have chronic fatigue related to insomnia.  Primary insomnia: She is going to start taking Gabapentin 300 mg at bedtime.  We also discussed starting melatonin at bedtime.    DDD (degenerative disc disease), lumbar: Chronic pain.  She sees Dr. Neomia DearVoss.   History of depression: She is on Wellbutrin.   History of anxiety: She is on Buspar.    Other medical conditions are listed as follows:   History of IBS  History of migraine  History of stomach ulcers    Orders: Orders Placed This Encounter  Procedures  . Trigger Point Inj  . Ambulatory referral to Physical Medicine Rehab   Meds ordered this encounter  Medications  . gabapentin (NEURONTIN) 300 MG capsule    Sig: Take 1 capsule (300 mg total) by mouth at bedtime.    Dispense:  30 capsule    Refill:  2    Face-to-face time spent with patient was 30 minutes. Greater than 50% of time was spent in counseling and coordination of care.  Follow-Up  Instructions: Return in about 6 months (around 09/17/2017) for Fibromyalgia, Osteoarthritis.   Pollyann SavoyShaili Aimie Wagman, MD  Note - This record has been created using Animal [employer] software.  Chart creation errors have been sought, but may not always  have been located. Such creation errors do not reflect on  the standard of medical care.

## 2017-03-20 ENCOUNTER — Ambulatory Visit: Payer: BLUE CROSS/BLUE SHIELD | Admitting: Rheumatology

## 2017-03-20 ENCOUNTER — Encounter: Payer: Self-pay | Admitting: Rheumatology

## 2017-03-20 VITALS — BP 140/88 | HR 77 | Resp 16 | Ht 62.0 in | Wt 221.0 lb

## 2017-03-20 DIAGNOSIS — M797 Fibromyalgia: Secondary | ICD-10-CM

## 2017-03-20 DIAGNOSIS — Z8659 Personal history of other mental and behavioral disorders: Secondary | ICD-10-CM | POA: Diagnosis not present

## 2017-03-20 DIAGNOSIS — R202 Paresthesia of skin: Secondary | ICD-10-CM

## 2017-03-20 DIAGNOSIS — M5136 Other intervertebral disc degeneration, lumbar region: Secondary | ICD-10-CM

## 2017-03-20 DIAGNOSIS — M170 Bilateral primary osteoarthritis of knee: Secondary | ICD-10-CM | POA: Diagnosis not present

## 2017-03-20 DIAGNOSIS — R5383 Other fatigue: Secondary | ICD-10-CM

## 2017-03-20 DIAGNOSIS — Z8669 Personal history of other diseases of the nervous system and sense organs: Secondary | ICD-10-CM | POA: Diagnosis not present

## 2017-03-20 DIAGNOSIS — M62838 Other muscle spasm: Secondary | ICD-10-CM

## 2017-03-20 DIAGNOSIS — F5101 Primary insomnia: Secondary | ICD-10-CM | POA: Diagnosis not present

## 2017-03-20 DIAGNOSIS — Z8719 Personal history of other diseases of the digestive system: Secondary | ICD-10-CM

## 2017-03-20 DIAGNOSIS — Z8711 Personal history of peptic ulcer disease: Secondary | ICD-10-CM

## 2017-03-20 DIAGNOSIS — R200 Anesthesia of skin: Secondary | ICD-10-CM

## 2017-03-20 MED ORDER — TRIAMCINOLONE ACETONIDE 40 MG/ML IJ SUSP
10.0000 mg | INTRAMUSCULAR | Status: AC | PRN
  Administered 2017-03-20: 10 mg via INTRAMUSCULAR

## 2017-03-20 MED ORDER — LIDOCAINE HCL 1 % IJ SOLN
0.5000 mL | INTRAMUSCULAR | Status: AC | PRN
Start: 2017-03-20 — End: 2017-03-20
  Administered 2017-03-20: .5 mL

## 2017-03-20 MED ORDER — GABAPENTIN 300 MG PO CAPS
300.0000 mg | ORAL_CAPSULE | Freq: Every day | ORAL | 2 refills | Status: DC
Start: 2017-03-20 — End: 2017-05-02

## 2017-03-20 MED ORDER — LIDOCAINE HCL 1 % IJ SOLN
0.5000 mL | INTRAMUSCULAR | Status: AC | PRN
  Administered 2017-03-20: .5 mL

## 2017-03-20 NOTE — Patient Instructions (Signed)
Sacroiliac Joint Dysfunction Sacroiliac joint dysfunction is a condition that causes inflammation on one or both sides of the sacroiliac (SI) joint. The SI joint connects the lower part of the spine (sacrum) with the two upper portions of the pelvis (ilium). This condition causes deep aching or burning pain in the low back. In some cases, the pain may also spread into one or both buttocks or hips or spread down the legs. What are the causes? This condition may be caused by:  Pregnancy. During pregnancy, extra stress is put on the SI joints because the pelvis widens.  Injury, such as: ? Car accidents. ? Sport-related injuries. ? Work-related injuries.  Having one leg that is shorter than the other.  Conditions that affect the joints, such as: ? Rheumatoid arthritis. ? Gout. ? Psoriatic arthritis. ? Joint infection (septic arthritis).  Sometimes, the cause of SI joint dysfunction is not known. What are the signs or symptoms? Symptoms of this condition include:  Aching or burning pain in the lower back. The pain may also spread to other areas, such as: ? Buttocks. ? Groin. ? Thighs and legs.  Muscle spasms in or around the painful areas.  Increased pain when standing, walking, running, stair climbing, bending, or lifting.  How is this diagnosed? Your health care provider will do a physical exam and take your medical history. During the exam, the health care provider may move one or both of your legs to different positions to check for pain. Various tests may be done to help verify the diagnosis, including:  Imaging tests to look for other causes of pain. These may include: ? MRI. ? CT scan. ? Bone scan.  Diagnostic injection. A numbing medicine is injected into the SI joint using a needle. If the pain is temporarily improved or stopped after the injection, this can indicate that SI joint dysfunction is the problem.  How is this treated? Treatment may vary depending on the  cause and severity of your condition. Treatment options may include:  Applying ice or heat to the lower back area. This can help to reduce pain and muscle spasms.  Medicines to relieve pain or inflammation or to relax the muscles.  Wearing a back brace (sacroiliac brace) to help support the joint while your back is healing.  Physical therapy to increase muscle strength around the joint and flexibility at the joint. This may also involve learning proper body positions and ways of moving to relieve stress on the joint.  Direct manipulation of the SI joint.  Injections of steroid medicine into the joint in order to reduce pain and swelling.  Radiofrequency ablation to burn away nerves that are carrying pain messages from the joint.  Use of a device that provides electrical stimulation in order to reduce pain at the joint.  Surgery to put in screws and plates that limit or prevent joint motion. This is rare.  Follow these instructions at home:  Rest as needed. Limit your activities as directed by your health care provider.  Take medicines only as directed by your health care provider.  If directed, apply ice to the affected area: ? Put ice in a plastic bag. ? Place a towel between your skin and the bag. ? Leave the ice on for 20 minutes, 2-3 times per day.  Use a heating pad or a moist heat pack as directed by your health care provider.  Exercise as directed by your health care provider or physical therapist.  Keep all follow-up visits   as directed by your health care provider. This is important. Contact a health care provider if:  Your pain is not controlled with medicine.  You have a fever.  You have increasingly severe pain. Get help right away if:  You have weakness, numbness, or tingling in your legs or feet.  You lose control of your bladder or bowel. This information is not intended to replace advice given to you by your health care provider. Make sure you discuss  any questions you have with your health care provider. Document Released: 05/13/2008 Document Revised: 07/23/2015 Document Reviewed: 10/22/2013 Elsevier Interactive Patient Education  2018 [employer]  

## 2017-03-24 ENCOUNTER — Telehealth (INDEPENDENT_AMBULATORY_CARE_PROVIDER_SITE_OTHER): Payer: Self-pay | Admitting: Rheumatology

## 2017-03-24 NOTE — Telephone Encounter (Signed)
Original request for records from [facility] processed by [facility]. They still have not received records yet. I faxed records today

## 2017-03-28 ENCOUNTER — Ambulatory Visit: Payer: BLUE CROSS/BLUE SHIELD | Admitting: Rheumatology

## 2017-04-06 ENCOUNTER — Encounter (INDEPENDENT_AMBULATORY_CARE_PROVIDER_SITE_OTHER): Payer: Self-pay | Admitting: Physical Medicine and Rehabilitation

## 2017-04-06 ENCOUNTER — Ambulatory Visit (INDEPENDENT_AMBULATORY_CARE_PROVIDER_SITE_OTHER): Payer: BLUE CROSS/BLUE SHIELD | Admitting: Physical Medicine and Rehabilitation

## 2017-04-06 DIAGNOSIS — R202 Paresthesia of skin: Secondary | ICD-10-CM

## 2017-04-06 NOTE — Progress Notes (Deleted)
Pt states pain in left shoulder that radiates down to left hand mostly the pinky finger. Pt also states numbness in left hand. Pt states symptoms has been going on for about a month. Pt states driving, sleeping, and picking up an item makes the pain worse, heating pad at times makes symptoms better. Right hand dominant. No lotions or creams.

## 2017-04-10 NOTE — Progress Notes (Signed)
[name] - 52 y.o. female MRN [id number]  Date of birth: 09/10/1965  Office Visit Note: Visit Date: 04/06/2017 PCP: Ignatius SpeckingVyas, Dhruv B, MD Referred by: Ignatius SpeckingVyas, Dhruv B, MD  Subjective: Chief Complaint  Patient presents with  . Left Shoulder - Pain  . Left Hand - Pain, Numbness  . Left Arm - Pain   HPI: Sandy Waters is a 52 year old right-hand-dominant female who comes in today at the request of Dr. Corliss Skainseveshwar for electrodiagnostic evaluation of her left upper extremity.  She reports chronic worsening pain in the left shoulder that she feels radiates mostly from the elbow to the fifth digit on the left.  She states that she gets some numbness in general in the left hand she reports worsening over the last month or worse that driving and sleeping tend to make things worse.  She has a hard time picking up items as it seems to make it worse.  She uses a heating pad that seemed to make things better.  She has used anti-inflammatory medications and other medications without relief.  She has not had prior electrodiagnostic studies.  Her case is complicated by history of fibromyalgia as well as depression and anxiety.    ROS Otherwise per HPI.  Assessment & Plan: Visit Diagnoses:  1. Paresthesia of skin     Plan: No additional findings.  Impression: The above electrodiagnostic study is ABNORMAL and reveals evidence of a borderline to very mild left median nerve entrapment at the wrist (carpal tunnel syndrome) affecting sensory components. **Careful clinical correlation is paramount as this would not explain numbness in the fifth digit.  There is no significant electrodiagnostic evidence of any other focal nerve entrapment, brachial plexopathy or cervical radiculopathy.    As you know, this particular electrodiagnostic study cannot rule out chemical radiculitis or sensory only radiculopathy.  This electrodiagnostic study cannot rule out small fiber polyneuropathy and dysesthesias from central  pain sensitization syndromes such as fibromyalgia.  Recommendations: 1.  Follow-up with referring physician. 2.  Continue current management of symptoms.   Meds & Orders: No orders of the defined types were placed in this encounter.   Orders Placed This Encounter  Procedures  . NCV with EMG (electromyography)    Follow-up: Return for Dr. Corliss Skainseveshwar.   Procedures: No procedures performed  EMG & NCV Findings: Evaluation of the left median (across palm) sensory nerve showed prolonged distal peak latency (Wrist, 3.8 ms).  All remaining nerves (as indicated in the following tables) were within normal limits.    All examined muscles (as indicated in the following table) showed no evidence of electrical instability.    Impression: The above electrodiagnostic study is ABNORMAL and reveals evidence of a borderline to very mild left median nerve entrapment at the wrist (carpal tunnel syndrome) affecting sensory components. **Careful clinical correlation is paramount as this would not explain numbness in the fifth digit.  There is no significant electrodiagnostic evidence of any other focal nerve entrapment, brachial plexopathy or cervical radiculopathy.    As you know, this particular electrodiagnostic study cannot rule out chemical radiculitis or sensory only radiculopathy.  This electrodiagnostic study cannot rule out small fiber polyneuropathy and dysesthesias from central pain sensitization syndromes such as fibromyalgia.  Recommendations: 1.  Follow-up with referring physician. 2.  Continue current management of symptoms.   Nerve Conduction Studies Anti Sensory Summary Table   Stim Site NR Peak (ms) Norm Peak (ms) P-T Amp (V) Norm P-T Amp Site1 Site2 Delta-P (ms) Dist (cm) Vel (  m/s) Norm Vel (m/s)  Left Median Acr Palm Anti Sensory (2nd Digit)  32.6C  Wrist    *3.8 <3.6 42.9 >10 Wrist Palm 1.9 0.0    Palm    1.9 <2.0 50.7         Left Radial Anti Sensory (Base 1st Digit)  33.9C    Wrist    2.1 <3.1 38.9  Wrist Base 1st Digit 2.1 0.0    Left Ulnar Anti Sensory (5th Digit)  32.8C  Wrist    3.6 <3.7 38.3 >15.0 Wrist 5th Digit 3.6 14.0 39 >38   Motor Summary Table   Stim Site NR Onset (ms) Norm Onset (ms) O-P Amp (mV) Norm O-P Amp Site1 Site2 Delta-0 (ms) Dist (cm) Vel (m/s) Norm Vel (m/s)  Left Median Motor (Abd Poll Brev)  33.4C  Wrist    3.4 <4.2 13.5 >5 Elbow Wrist 3.9 19.8 51 >50  Elbow    7.3  10.3         Left Ulnar Motor (Abd Dig Min)  32C  Wrist    2.9 <4.2 8.8 >3 B Elbow Wrist 3.0 19.0 63 >53  B Elbow    5.9  7.8  A Elbow B Elbow 1.4 10.0 71 >53  A Elbow    7.3  8.0          EMG   Side Muscle Nerve Root Ins Act Fibs Psw Amp Dur Poly Recrt Int [name] Comment  Left Abd Poll Brev Median C8-T1 Nml Nml Nml Nml Nml 0 Nml Nml   Left 1stDorInt Ulnar C8-T1 Nml Nml Nml Nml Nml 0 Nml Nml   Left PronatorTeres Median C6-7 Nml Nml Nml Nml Nml 0 Nml Nml   Left Biceps Musculocut C5-6 Nml Nml Nml Nml Nml 0 Nml Nml   Left Deltoid Axillary C5-6 Nml Nml Nml Nml Nml 0 Nml Nml     Nerve Conduction Studies Anti Sensory Left/Right Comparison   Stim Site L Lat (ms) R Lat (ms) L-R Lat (ms) L Amp (V) R Amp (V) L-R Amp (%) Site1 Site2 L Vel (m/s) R Vel (m/s) L-R Vel (m/s)  Median Acr Palm Anti Sensory (2nd Digit)  32.6C  Wrist *3.8   42.9   Wrist Palm     Palm 1.9   50.7         Radial Anti Sensory (Base 1st Digit)  33.9C  Wrist 2.1   38.9   Wrist Base 1st Digit     Ulnar Anti Sensory (5th Digit)  32.8C  Wrist 3.6   38.3   Wrist 5th Digit 39     Motor Left/Right Comparison   Stim Site L Lat (ms) R Lat (ms) L-R Lat (ms) L Amp (mV) R Amp (mV) L-R Amp (%) Site1 Site2 L Vel (m/s) R Vel (m/s) L-R Vel (m/s)  Median Motor (Abd Poll Brev)  33.4C  Wrist 3.4   13.5   Elbow Wrist 51    Elbow 7.3   10.3         Ulnar Motor (Abd Dig Min)  32C  Wrist 2.9   8.8   B Elbow Wrist 63    B Elbow 5.9   7.8   A Elbow B Elbow 71    A Elbow 7.3   8.0            Waveforms:             Clinical History: No specialty comments available.  She reports that she quit smoking  about 24 years ago. she has never used smokeless tobacco. No results for input(s): HGBA1C, LABURIC in the last 8760 hours.  Objective:  VS:  HT:    WT:   BMI:     BP:   HR: bpm  TEMP: ( )  RESP:  Physical Exam  Musculoskeletal:  Inspection reveals no atrophy of the bilateral APB or FDI or hand intrinsics. There is no swelling, color changes, allodynia or dystrophic changes. There is 5 out of 5 strength in the bilateral wrist extension, finger abduction and long finger flexion. There is intact sensation to light touch in all dermatomal and peripheral nerve distributions. There is a negative Froment's test bilaterally. There is an equivocal Tinel's test at the left elbow.  I did not note ulnar subluxation. There is a negative Hoffmann's test bilaterally.    Ortho Exam Imaging: No results found.  Past Medical/Family/Surgical/Social History: Medications & Allergies reviewed per EMR There are no active problems to display for this patient.  Past Medical History:  Diagnosis Date  . Acid reflux   . Anxiety   . Depression   . Fibromyalgia   . High cholesterol    Family History  Problem Relation Age of Onset  . Heart disease Mother   . Hypertension Mother   . Hypertension Sister   . Diabetes Sister   . Hypertension Brother   . Schizophrenia Brother   . Diabetes Brother   . Scoliosis Daughter    Past Surgical History:  Procedure Laterality Date  . CESAREAN SECTION    . CHOLECYSTECTOMY    . SHOULDER SURGERY Right 08/30/2016   bone spur/scar tissue removed   . TONSILLECTOMY     Social History   Occupational History  . Not on file  Tobacco Use  . Smoking status: Former Smoker    Last attempt to quit: 02/28/1993    Years since quitting: 24.1  . Smokeless tobacco: Never Used  Substance and Sexual Activity  . Alcohol use: Yes    Comment: socially   . Drug use: No  . Sexual  activity: Not on file

## 2017-04-10 NOTE — Procedures (Signed)
EMG & NCV Findings: Evaluation of the left median (across palm) sensory nerve showed prolonged distal peak latency (Wrist, 3.8 ms).  All remaining nerves (as indicated in the following tables) were within normal limits.    All examined muscles (as indicated in the following table) showed no evidence of electrical instability.    Impression: The above electrodiagnostic study is ABNORMAL and reveals evidence of a borderline to very mild left median nerve entrapment at the wrist (carpal tunnel syndrome) affecting sensory components. **Careful clinical correlation is paramount as this would not explain numbness in the fifth digit.  There is no significant electrodiagnostic evidence of any other focal nerve entrapment, brachial plexopathy or cervical radiculopathy.    As you know, this particular electrodiagnostic study cannot rule out chemical radiculitis or sensory only radiculopathy.  This electrodiagnostic study cannot rule out small fiber polyneuropathy and dysesthesias from central pain sensitization syndromes such as fibromyalgia.  Recommendations: 1.  Follow-up with referring physician. 2.  Continue current management of symptoms.   Nerve Conduction Studies Anti Sensory Summary Table   Stim Site NR Peak (ms) Norm Peak (ms) P-T Amp (V) Norm P-T Amp Site1 Site2 Delta-P (ms) Dist (cm) Vel (m/s) Norm Vel (m/s)  Left Median Acr Palm Anti Sensory (2nd Digit)  32.6C  Wrist    *3.8 <3.6 42.9 >10 Wrist Palm 1.9 0.0    Palm    1.9 <2.0 50.7         Left Radial Anti Sensory (Base 1st Digit)  33.9C  Wrist    2.1 <3.1 38.9  Wrist Base 1st Digit 2.1 0.0    Left Ulnar Anti Sensory (5th Digit)  32.8C  Wrist    3.6 <3.7 38.3 >15.0 Wrist 5th Digit 3.6 14.0 39 >38   Motor Summary Table   Stim Site NR Onset (ms) Norm Onset (ms) O-P Amp (mV) Norm O-P Amp Site1 Site2 Delta-0 (ms) Dist (cm) Vel (m/s) Norm Vel (m/s)  Left Median Motor (Abd Poll Brev)  33.4C  Wrist    3.4 <4.2 13.5 >5 Elbow Wrist 3.9  19.8 51 >50  Elbow    7.3  10.3         Left Ulnar Motor (Abd Dig Min)  32C  Wrist    2.9 <4.2 8.8 >3 B Elbow Wrist 3.0 19.0 63 >53  B Elbow    5.9  7.8  A Elbow B Elbow 1.4 10.0 71 >53  A Elbow    7.3  8.0          EMG   Side Muscle Nerve Root Ins Act Fibs Psw Amp Dur Poly Recrt Int Dennie BiblePat Comment  Left Abd Poll Brev Median C8-T1 Nml Nml Nml Nml Nml 0 Nml Nml   Left 1stDorInt Ulnar C8-T1 Nml Nml Nml Nml Nml 0 Nml Nml   Left PronatorTeres Median C6-7 Nml Nml Nml Nml Nml 0 Nml Nml   Left Biceps Musculocut C5-6 Nml Nml Nml Nml Nml 0 Nml Nml   Left Deltoid Axillary C5-6 Nml Nml Nml Nml Nml 0 Nml Nml     Nerve Conduction Studies Anti Sensory Left/Right Comparison   Stim Site L Lat (ms) R Lat (ms) L-R Lat (ms) L Amp (V) R Amp (V) L-R Amp (%) Site1 Site2 L Vel (m/s) R Vel (m/s) L-R Vel (m/s)  Median Acr Palm Anti Sensory (2nd Digit)  32.6C  Wrist *3.8   42.9   Wrist Palm     Palm 1.9   50.7  Radial Anti Sensory (Base 1st Digit)  33.9C  Wrist 2.1   38.9   Wrist Base 1st Digit     Ulnar Anti Sensory (5th Digit)  32.8C  Wrist 3.6   38.3   Wrist 5th Digit 39     Motor Left/Right Comparison   Stim Site L Lat (ms) R Lat (ms) L-R Lat (ms) L Amp (mV) R Amp (mV) L-R Amp (%) Site1 Site2 L Vel (m/s) R Vel (m/s) L-R Vel (m/s)  Median Motor (Abd Poll Brev)  33.4C  Wrist 3.4   13.5   Elbow Wrist 51    Elbow 7.3   10.3         Ulnar Motor (Abd Dig Min)  32C  Wrist 2.9   8.8   B Elbow Wrist 63    B Elbow 5.9   7.8   A Elbow B Elbow 71    A Elbow 7.3   8.0            Waveforms:

## 2017-04-25 NOTE — Progress Notes (Deleted)
   Office Visit Note  Patient: Sandy Waters             Date of Birth: 05/29/1965           MRN: [id number]             PCP: Ignatius SpeckingVyas, Dhruv B, MD Referring: Ignatius SpeckingVyas, Dhruv B, MD Visit Date: 04/26/2017 Occupation: @GUAROCC @    Subjective:  No chief complaint on file.   History of Present Illness: Sandy Waters is a 52 y.o. female ***   Activities of Daily Living:  Patient reports morning stiffness for *** {minute/hour:19697}.   Patient {ACTIONS;DENIES/REPORTS:21021675::"Denies"} nocturnal pain.  Difficulty dressing/grooming: {ACTIONS;DENIES/REPORTS:21021675::"Denies"} Difficulty climbing stairs: {ACTIONS;DENIES/REPORTS:21021675::"Denies"} Difficulty getting out of chair: {ACTIONS;DENIES/REPORTS:21021675::"Denies"} Difficulty using hands for taps, buttons, cutlery, and/or writing: {ACTIONS;DENIES/REPORTS:21021675::"Denies"}   No Rheumatology ROS completed.   PMFS History:  There are no active problems to display for this patient.   Past Medical History:  Diagnosis Date  . Acid reflux   . Anxiety   . Depression   . Fibromyalgia   . High cholesterol     Family History  Problem Relation Age of Onset  . Heart disease Mother   . Hypertension Mother   . Hypertension Sister   . Diabetes Sister   . Hypertension Brother   . Schizophrenia Brother   . Diabetes Brother   . Scoliosis Daughter    Past Surgical History:  Procedure Laterality Date  . CESAREAN SECTION    . CHOLECYSTECTOMY    . SHOULDER SURGERY Right 08/30/2016   bone spur/scar tissue removed   . TONSILLECTOMY     Social History   Social History Narrative  . Not on file     Objective: Vital Signs: There were no vitals taken for this visit.   Physical Exam   Musculoskeletal Exam: ***  CDAI Exam: No CDAI exam completed.    Investigation: No additional findings.   Imaging: No results found.  Speciality Comments: No specialty comments available.    Procedures:  No procedures  performed Allergies: Macrodantin [nitrofurantoin macrocrystal]; Sulfa antibiotics; and Latex   Assessment / Plan:     Visit Diagnoses: No diagnosis found.    Orders: No orders of the defined types were placed in this encounter.  No orders of the defined types were placed in this encounter.   Face-to-face time spent with patient was *** minutes. 50% of time was spent in counseling and coordination of care.  Follow-Up Instructions: No Follow-up on file.   Ellen HenriMarissa C Rosamae Rocque, CMA  Note - This record has been created using Animal nutritionistDragon software.  Chart creation errors have been sought, but may not always  have been located. Such creation errors do not reflect on  the standard of medical care.

## 2017-04-26 ENCOUNTER — Ambulatory Visit: Payer: BLUE CROSS/BLUE SHIELD | Admitting: Rheumatology

## 2017-04-26 NOTE — Progress Notes (Addendum)
Office Visit Note  Patient: Sandy Waters             Date of Birth: [birth year]           MRN: [id number]             PCP: Ignatius Specking, MD Referring: Ignatius Specking, MD Visit Date: 05/02/2017 Occupation: @GUAROCC @    Subjective:  Right SI joint tenderness    History of Present Illness: Sandy Waters is a 52 y.o. female with history of fibromyalgia, DDD, and osteoarthritis.  Patient had a nerve conduction study on 04/06/17 that revealed borderline to very mild left median nerve entrapment at the wrist.  She continues to have pain and numbness in her left 4th and 5th digit.  She states that she continues to have lower back pain and radiation of pain down the right leg.  She reports bilateral knee pain as well.  She states her fibromyalgia has been flaring.  She reports increased generalized muscle tenderness and muscle tension.  She states she continues to take tramadol 50 mg twice daily as needed.  She has also been using Voltaren gel.  She states she feels her fibromyalgia has been worsening.  She discontinued gabapentin due to thinking it was contributing to her confusion and forgetfulness.  She states her fatigue and insomnia have been worsening.   Activities of Daily Living:  Patient reports morning stiffness for 15 minutes.   Patient Reports nocturnal pain.  Difficulty dressing/grooming: Denies Difficulty climbing stairs: Reports Difficulty getting out of chair: Reports Difficulty using hands for taps, buttons, cutlery, and/or writing: Reports   Review of Systems  Constitutional: Negative for fatigue and weakness.  HENT: Positive for mouth dryness. Negative for mouth sores, trouble swallowing, trouble swallowing and nose dryness.   Eyes: Negative for redness, itching, visual disturbance and dryness.  Respiratory: Negative for cough, hemoptysis, shortness of breath and difficulty breathing.   Cardiovascular: Negative for chest pain, palpitations, hypertension, irregular  heartbeat and swelling in legs/feet.  Gastrointestinal: Positive for diarrhea and heartburn (On Protonix). Negative for blood in stool and constipation.  Endocrine: Negative for increased urination.  Genitourinary: Negative for painful urination.  Musculoskeletal: Positive for arthralgias, joint pain, joint swelling, myalgias, morning stiffness, muscle tenderness and myalgias. Negative for muscle weakness.  Skin: Positive for sensitivity to sunlight. Negative for color change, rash, hair loss, nodules/bumps, redness, skin tightness and ulcers.  Allergic/Immunologic: Negative for susceptible to infections.  Neurological: Negative for dizziness, numbness and headaches.  Hematological: Negative for swollen glands.  Psychiatric/Behavioral: Positive for depressed mood and sleep disturbance. The patient is nervous/anxious (On Buspar PRN).     PMFS History:  There are no active problems to display for this patient.   Past Medical History:  Diagnosis Date  . Acid reflux   . Anxiety   . Depression   . Fibromyalgia   . High cholesterol     Family History  Problem Relation Age of Onset  . Heart disease Mother   . Hypertension Mother   . Diabetes Mother   . Cancer Father        prostate   . Hypertension Sister   . Diabetes Sister   . Hypertension Brother   . Schizophrenia Brother   . Diabetes Brother   . Scoliosis Daughter    Past Surgical History:  Procedure Laterality Date  . CESAREAN SECTION    . CHOLECYSTECTOMY    . SHOULDER SURGERY Right 08/30/2016   bone spur/scar tissue removed   .  TONSILLECTOMY     Social History   Social History Narrative  . Not on file     Objective: Vital Signs: BP 120/87 (BP Location: Left Arm, Patient Position: Sitting, Cuff Size: Normal)   Pulse (!) 57   Resp 17   Ht 5\' 3"  (1.6 m)   Wt 217 lb (98.4 kg)   BMI 38.44 kg/m    Physical Exam  Constitutional: She is oriented to person, place, and time. She appears well-developed and  well-nourished.  HENT:  Head: Normocephalic and atraumatic.  Eyes: Conjunctivae and EOM are normal.  Neck: Normal range of motion.  Cardiovascular: Normal rate, regular rhythm, normal heart sounds and intact distal pulses.  Pulmonary/Chest: Effort normal and breath sounds normal.  Abdominal: Soft. Bowel sounds are normal.  Lymphadenopathy:    She has no cervical adenopathy.  Neurological: She is alert and oriented to person, place, and time.  Skin: Skin is warm and dry. Capillary refill takes less than 2 seconds.  Psychiatric: She has a normal mood and affect. Her behavior is normal.  Nursing note and vitals reviewed.    Musculoskeletal Exam: C-spine, thoracic, and lumbar spine good ROM.  No midline spinal tenderness.  She has right SI joint tenderness.  Shoulder joints, elbow joints, wrist joints, MCPs, PIPs, and DIPs good ROM with no synovitis.  Positive Tinel's and Phalen's sign of the left wrist.  Hip joints, knee joints, ankle joints, MTPs, PIPs, and DIPs good ROM with no synovitis.  No warmth or effusion was noted.  No she has no tenderness of trochanteric bursa.   CDAI Exam: No CDAI exam completed.    Investigation: No additional findings.   Imaging: No results found.  Speciality Comments: No specialty comments available.    Procedures:  Sacroiliac Joint Inj on 05/02/2017 4:24 PM Indications: pain Details: 27 G 1.5 in needle, posterior approach Medications: 1 mL lidocaine 1 %; 40 mg triamcinolone acetonide 40 MG/ML Aspirate: 0 mL Outcome: tolerated well, no immediate complications Procedure, treatment alternatives, risks and benefits explained, specific risks discussed. Consent was given by the patient. Immediately prior to procedure a time out was called to verify the correct patient, procedure, equipment, support staff and site/side marked as required. Patient was prepped and draped in the usual sterile fashion.     Allergies: Macrodantin [nitrofurantoin  macrocrystal]; Sulfa antibiotics; and Latex   Assessment / Plan:     Visit Diagnoses: Fibromyalgia -she has been experiencing increased muscle tension and muscle tenderness.  She takes tramadol 50 mg twice a day as needed for pain relief.  She is no longer taking Mobic.  She discontinued Gabapentin 300 mg due to it causing increased drowsiness and confusion.  She has been having increased fibro fog.  She was encouraged to continue to exercise and stretch on a regular basis.  Other fatigue: Chronic and related to insomnia.  She was encouraged to start to exercise more regularly.   Other insomnia: She reports Gabapentin helped her sleep but she was considerably drowsy the morning after taking it.  She discontinued taking Gabapentin.  Good sleep hygiene was discussed.    Carpal tunnel syndrome, left upper limb - She had a NCV with EMG performed on 04/06/17.  The study revealed a borderline to very mild median nerve entrapment at the left wrist.  We discussed her treatment options today.  She was given a script for a carpal tunnel splint that she can wear at night.  Also discussed trying a cortisone injection in the future  if the brace does not help. She discontinued taking Gabapentin due to not noticing a benefit and having increased confusion and drowsiness.   Chronic right SI joint pain: She has tenderness of the right SI joint with radiation of pain down the posterior aspect of her left thigh to her knee.  She states the pain is worse with standing and sitting for prolonged periods of time.  She requested a cortisone injection today in the office.  Risks and benefits were discussed.  She tolerated the procedure well.   Primary osteoarthritis of both knees - Good ROM with no knee crepitus.  No warmth or effusion on exam. She uses voltaren gel.   DDD (degenerative disc disease), lumbar: Chronic pain.  She had a MRI of her lumbar spine on 12/11/14.   Other medical conditions are listed as follows:    Anxiety and depression  High cholesterol  History of IBS  History of migraine  History of stomach ulcers    Orders: Orders Placed This Encounter  Procedures  . Sacroiliac Joint Inj   No orders of the defined types were placed in this encounter.   Face-to-face time spent with patient was 30 minutes. Greater than 50% of time was spent in counseling and coordination of care.  Follow-Up Instructions: No Follow-up on file.   Gearldine Bienenstock, PA-C   Pollyann Savoy, MD Note - This record has been created using Dragon software.  Chart creation errors have been sought, but may not always  have been located. Such creation errors do not reflect on  the standard of medical care.

## 2017-04-28 DIAGNOSIS — K760 Fatty (change of) liver, not elsewhere classified: Secondary | ICD-10-CM

## 2017-04-28 HISTORY — DX: Fatty (change of) liver, not elsewhere classified: K76.0

## 2017-05-02 ENCOUNTER — Encounter: Payer: Self-pay | Admitting: Physician Assistant

## 2017-05-02 ENCOUNTER — Ambulatory Visit: Payer: BLUE CROSS/BLUE SHIELD | Admitting: Physician Assistant

## 2017-05-02 VITALS — BP 120/87 | HR 57 | Resp 17 | Ht 63.0 in | Wt 217.0 lb

## 2017-05-02 DIAGNOSIS — Z8719 Personal history of other diseases of the digestive system: Secondary | ICD-10-CM | POA: Diagnosis not present

## 2017-05-02 DIAGNOSIS — M5136 Other intervertebral disc degeneration, lumbar region: Secondary | ICD-10-CM | POA: Diagnosis not present

## 2017-05-02 DIAGNOSIS — R5383 Other fatigue: Secondary | ICD-10-CM

## 2017-05-02 DIAGNOSIS — G5602 Carpal tunnel syndrome, left upper limb: Secondary | ICD-10-CM

## 2017-05-02 DIAGNOSIS — M533 Sacrococcygeal disorders, not elsewhere classified: Secondary | ICD-10-CM

## 2017-05-02 DIAGNOSIS — F419 Anxiety disorder, unspecified: Secondary | ICD-10-CM

## 2017-05-02 DIAGNOSIS — F32A Depression, unspecified: Secondary | ICD-10-CM

## 2017-05-02 DIAGNOSIS — E7800 Pure hypercholesterolemia, unspecified: Secondary | ICD-10-CM

## 2017-05-02 DIAGNOSIS — M797 Fibromyalgia: Secondary | ICD-10-CM

## 2017-05-02 DIAGNOSIS — G4709 Other insomnia: Secondary | ICD-10-CM

## 2017-05-02 DIAGNOSIS — Z8711 Personal history of peptic ulcer disease: Secondary | ICD-10-CM

## 2017-05-02 DIAGNOSIS — G8929 Other chronic pain: Secondary | ICD-10-CM

## 2017-05-02 DIAGNOSIS — Z8669 Personal history of other diseases of the nervous system and sense organs: Secondary | ICD-10-CM

## 2017-05-02 DIAGNOSIS — M170 Bilateral primary osteoarthritis of knee: Secondary | ICD-10-CM | POA: Diagnosis not present

## 2017-05-02 DIAGNOSIS — F329 Major depressive disorder, single episode, unspecified: Secondary | ICD-10-CM

## 2017-05-02 DIAGNOSIS — M51369 Other intervertebral disc degeneration, lumbar region without mention of lumbar back pain or lower extremity pain: Secondary | ICD-10-CM

## 2017-05-02 MED ORDER — TRIAMCINOLONE ACETONIDE 40 MG/ML IJ SUSP
40.0000 mg | INTRAMUSCULAR | Status: AC | PRN
  Administered 2017-05-02: 40 mg via INTRA_ARTICULAR

## 2017-05-02 MED ORDER — LIDOCAINE HCL 1 % IJ SOLN
1.0000 mL | INTRAMUSCULAR | Status: AC | PRN
  Administered 2017-05-02: 1 mL

## 2017-05-17 ENCOUNTER — Encounter: Payer: Self-pay | Admitting: Gastroenterology

## 2017-06-12 ENCOUNTER — Other Ambulatory Visit: Payer: Self-pay | Admitting: Physician Assistant

## 2017-06-12 NOTE — Telephone Encounter (Signed)
Last Visit: 05/02/17 Next Visit: 09/21/17  Okay to refill per Dr. Corliss Skainseveshwar

## 2017-07-10 ENCOUNTER — Telehealth: Payer: Self-pay | Admitting: Gastroenterology

## 2017-07-10 ENCOUNTER — Ambulatory Visit: Payer: BLUE CROSS/BLUE SHIELD | Admitting: Gastroenterology

## 2017-07-10 ENCOUNTER — Encounter: Payer: Self-pay | Admitting: Gastroenterology

## 2017-07-10 NOTE — Telephone Encounter (Signed)
PATIENT WAS A NO SHOW AND LETTER SENT  °

## 2017-09-08 NOTE — Progress Notes (Signed)
Office Visit Note  Patient: Sandy Waters             Date of Birth: 02/27/1966           MRN: [id number]             PCP: Ignatius SpeckingVyas, Dhruv B, MD Referring: Ignatius SpeckingVyas, Dhruv B, MD Visit Date: 09/21/2017 Occupation: @GUAROCC @  Subjective:  Pain in neck and bilateral hand numbness.   History of Present Illness: Sandy Waters is a 52 y.o. female history of fibromyalgia, osteoarthritis and disc disease.  She states she has been experiencing numbness in her bilateral hands.  She had a nerve conduction velocity in the February 2000 19 Which Showed Mild Carpal Tunnel syndrome.  She is been also experiencing neck and pain and stiffness.  She had right shoulder surgery for this purpose.  She has been having some paresthesias in her right arm.  She had seen the orthopedic surgeon for a follow-up visit.  Her knee joint pain is better.  She continues to have some lower back discomfort.  Activities of Daily Living:  Patient reports morning stiffness for 30 minutes.   Patient Reports nocturnal pain.  Difficulty dressing/grooming: Denies Difficulty climbing stairs: Reports Difficulty getting out of chair: Reports Difficulty using hands for taps, buttons, cutlery, and/or writing: Denies  Review of Systems  Constitutional: Positive for fatigue. Negative for night sweats, weight gain and weight loss.  HENT: Positive for mouth dryness. Negative for mouth sores, trouble swallowing, trouble swallowing and nose dryness.   Eyes: Positive for dryness. Negative for pain, redness and visual disturbance.  Respiratory: Negative for cough, shortness of breath and difficulty breathing.   Cardiovascular: Negative for chest pain, palpitations, hypertension, irregular heartbeat and swelling in legs/feet.  Gastrointestinal: Negative for blood in stool, constipation and diarrhea.  Endocrine: Negative for increased urination.  Genitourinary: Negative for vaginal dryness.  Musculoskeletal: Positive for arthralgias, joint  pain, myalgias, morning stiffness and myalgias. Negative for joint swelling, muscle weakness and muscle tenderness.  Skin: Negative for color change, rash, hair loss, skin tightness, ulcers and sensitivity to sunlight.  Allergic/Immunologic: Negative for susceptible to infections.  Neurological: Negative for dizziness, memory loss, night sweats and weakness.  Hematological: Negative for swollen glands.  Psychiatric/Behavioral: Positive for depressed mood and sleep disturbance. The patient is nervous/anxious.     PMFS History:  Patient Active Problem List   Diagnosis Date Noted  . Fibromyalgia 09/21/2017  . Other insomnia 09/21/2017  . Other fatigue 09/21/2017  . Carpal tunnel syndrome, left upper limb 09/21/2017  . 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 Primary osteoarthritis of both knees 09/21/2017  . DDD (degenerative disc disease), lumbar 09/21/2017  . Anxiety and depression 09/21/2017  . History of IBS 09/21/2017  . History of migraine 09/21/2017  . High cholesterol 09/21/2017  . History of stomach ulcers 09/21/2017    Past Medical History:  Diagnosis Date  . Acid reflux   . Anxiety   . Depression   . Fibromyalgia   . High cholesterol     Family History  Problem Relation Age of Onset  . Heart disease Mother   . Hypertension Mother   . Diabetes Mother   . Cancer Father        prostate   . Hypertension Sister   . Diabetes Sister   . Hypertension Brother   . Schizophrenia Brother   . Diabetes Brother   . Scoliosis Daughter    Past Surgical History:  Procedure Laterality Date  .  CESAREAN SECTION    . CHOLECYSTECTOMY    . SHOULDER SURGERY Right 08/30/2016   bone spur/scar tissue removed   . TONSILLECTOMY     Social History   Social History Narrative  . Not on file    Objective: Vital Signs: BP 123/88 (BP Location: Left Arm, Patient Position: Sitting, Cuff Size: Normal)   Pulse 71   Resp 15   Ht 5\' 3"  (1.6 m)   Wt 214 lb (97.1 kg)   BMI 37.91 kg/m     Physical Exam  Constitutional: She is oriented to person, place, and time. She appears well-developed and well-nourished.  HENT:  Head: Normocephalic and atraumatic.  Eyes: Conjunctivae and EOM are normal.  Neck: Normal range of motion.  Cardiovascular: Normal rate, regular rhythm, normal heart sounds and intact distal pulses.  Pulmonary/Chest: Effort normal and breath sounds normal.  Abdominal: Soft. Bowel sounds are normal.  Lymphadenopathy:    She has no cervical adenopathy.  Neurological: She is alert and oriented to person, place, and time.  Skin: Skin is warm and dry. Capillary refill takes less than 2 seconds.  Psychiatric: She has a normal mood and affect. Her behavior is normal.  Nursing note and vitals reviewed.    Musculoskeletal Exam: C-spine thoracic and lumbar spine limited range of motion with some discomfort.  She also had a lot of spasm in the trapezius area.  Shoulder joints elbow joints wrist joints were in good range of motion.  Tinel's was positive bilaterally.  Hip joints knee joints ankles MTPs PIPs were in good range of motion with no synovitis.  She has generalized hyperalgesia and positive tender points.  CDAI Exam: No CDAI exam completed.   Investigation: No additional findings.  Imaging: No results found.  Recent Labs: Lab Results  Component Value Date   WBC 5.4 03/11/2012   HGB 13.9 03/11/2012   PLT 304 03/11/2012   NA 142 03/11/2012   K 3.7 03/11/2012   CL 107 03/11/2012   CO2 23 03/11/2012   GLUCOSE 103 (H) 03/11/2012   BUN 9 03/11/2012   CREATININE 0.78 03/11/2012   CALCIUM 9.8 03/11/2012   GFRAA >90 03/11/2012    Speciality Comments: No specialty comments available.  Procedures:  Trigger Point Inj Date/Time: 09/21/2017 3:14 PM Performed by: Pollyann Savoy, MD Authorized by: Pollyann Savoy, MD   Consent Given by:  Patient Site marked: the procedure site was marked   Timeout: prior to procedure the correct patient,  procedure, and site was verified   Indications:  Muscle spasm and pain Total # of Trigger Points:  2 Location: neck   Needle Size:  27 G Approach:  Dorsal Medications #1:  0.5 mL lidocaine 1 %; 10 mg triamcinolone acetonide 40 MG/ML Medications #2:  0.5 mL lidocaine 1 %; 10 mg triamcinolone acetonide 40 MG/ML Patient tolerance:  Patient tolerated the procedure well with no immediate complications   Allergies: Macrodantin [nitrofurantoin macrocrystal]; Sulfa antibiotics; and Latex   Assessment / Plan:     Visit Diagnoses: Fibromyalgia-she continues to have generalized pain discomfort and positive tender points.  She is having a flare of fibromyalgia.  Need for regular exercise was discussed.  I will give her a prescription for water aerobics.  Other insomnia-good sleep hygiene was discussed.  Other fatigue-related to insomnia.  Neck pain-she has been having increased neck discomfort and trapezius spasm.  Per her request bilateral trapezius area were injected with cortisone today she tolerated the procedure well.  Carpal tunnel syndrome, left upper  limb - She had a NCV with EMG performed on 04/06/17.  The study revealed a borderline to very mild median nerve entrapment at the left wrist.  She is having symptoms of carpal tunnel syndrome in her bilateral hands.  Have advised her to use carpal tunnel brace.  Chronic right SI joint pain-she has been having some discomfort in the SI joint.  Primary osteoarthritis of both knees-knee joint pain is tolerable.  A prescription refill for Voltaren gel was given.  Side effects were reviewed.  DDD (degenerative disc disease), lumbar - She had a MRI of her lumbar spine on 12/11/14  Other medical problems are listed as follows:  Anxiety and depression  History of IBS  History of migraine  High cholesterol  History of stomach ulcers   Orders: Orders Placed This Encounter  Procedures  . Trigger Point Inj   Meds ordered this encounter    Medications  . diclofenac sodium (VOLTAREN) 1 % GEL    Sig: Voltaren Gel 3 grams to 3 large joints upto TID 10 TUBES with 3 refills    Dispense:  5 Tube    Refill:  0    Voltaren Gel 3 grams to 3 large joints upto TID 10 TUBES with 3 refills    Face-to-face time spent with patient was 30 minutes. Greater than 50% of time was spent in counseling and coordination of care.  Follow-Up Instructions: Return in about 6 months (around 03/24/2018) for FMS,OA,DDD.   Pollyann Savoy, MD  Note - This record has been created using Animal nutritionist.  Chart creation errors have been sought, but may not always  have been located. Such creation errors do not reflect on  the standard of medical care.

## 2017-09-21 ENCOUNTER — Ambulatory Visit: Payer: BLUE CROSS/BLUE SHIELD | Admitting: Rheumatology

## 2017-09-21 ENCOUNTER — Encounter: Payer: Self-pay | Admitting: Rheumatology

## 2017-09-21 VITALS — BP 123/88 | HR 71 | Resp 15 | Ht 63.0 in | Wt 214.0 lb

## 2017-09-21 DIAGNOSIS — G8929 Other chronic pain: Secondary | ICD-10-CM

## 2017-09-21 DIAGNOSIS — Z8711 Personal history of peptic ulcer disease: Secondary | ICD-10-CM

## 2017-09-21 DIAGNOSIS — M797 Fibromyalgia: Secondary | ICD-10-CM | POA: Diagnosis not present

## 2017-09-21 DIAGNOSIS — F329 Major depressive disorder, single episode, unspecified: Secondary | ICD-10-CM

## 2017-09-21 DIAGNOSIS — R5383 Other fatigue: Secondary | ICD-10-CM | POA: Diagnosis not present

## 2017-09-21 DIAGNOSIS — E785 Hyperlipidemia, unspecified: Secondary | ICD-10-CM | POA: Insufficient documentation

## 2017-09-21 DIAGNOSIS — F419 Anxiety disorder, unspecified: Secondary | ICD-10-CM

## 2017-09-21 DIAGNOSIS — M170 Bilateral primary osteoarthritis of knee: Secondary | ICD-10-CM

## 2017-09-21 DIAGNOSIS — G4709 Other insomnia: Secondary | ICD-10-CM

## 2017-09-21 DIAGNOSIS — Z8669 Personal history of other diseases of the nervous system and sense organs: Secondary | ICD-10-CM

## 2017-09-21 DIAGNOSIS — G5602 Carpal tunnel syndrome, left upper limb: Secondary | ICD-10-CM

## 2017-09-21 DIAGNOSIS — F32A Depression, unspecified: Secondary | ICD-10-CM | POA: Insufficient documentation

## 2017-09-21 DIAGNOSIS — M542 Cervicalgia: Secondary | ICD-10-CM | POA: Diagnosis not present

## 2017-09-21 DIAGNOSIS — M533 Sacrococcygeal disorders, not elsewhere classified: Secondary | ICD-10-CM

## 2017-09-21 DIAGNOSIS — Z8719 Personal history of other diseases of the digestive system: Secondary | ICD-10-CM

## 2017-09-21 DIAGNOSIS — E7800 Pure hypercholesterolemia, unspecified: Secondary | ICD-10-CM

## 2017-09-21 DIAGNOSIS — M51369 Other intervertebral disc degeneration, lumbar region without mention of lumbar back pain or lower extremity pain: Secondary | ICD-10-CM

## 2017-09-21 DIAGNOSIS — M5136 Other intervertebral disc degeneration, lumbar region: Secondary | ICD-10-CM

## 2017-09-21 HISTORY — DX: Personal history of peptic ulcer disease: Z87.11

## 2017-09-21 HISTORY — DX: Other insomnia: G47.09

## 2017-09-21 HISTORY — DX: Carpal tunnel syndrome, left upper limb: G56.02

## 2017-09-21 HISTORY — DX: Other chronic pain: G89.29

## 2017-09-21 MED ORDER — DICLOFENAC SODIUM 1 % TD GEL: TRANSDERMAL | 0 refills | Status: DC

## 2017-09-21 MED ORDER — TRIAMCINOLONE ACETONIDE 40 MG/ML IJ SUSP
10.0000 mg | INTRAMUSCULAR | Status: AC | PRN
  Administered 2017-09-21: 10 mg via INTRAMUSCULAR

## 2017-09-21 MED ORDER — LIDOCAINE HCL 1 % IJ SOLN
0.5000 mL | INTRAMUSCULAR | Status: AC | PRN
  Administered 2017-09-21: .5 mL

## 2017-10-23 ENCOUNTER — Other Ambulatory Visit: Payer: Self-pay | Admitting: Internal Medicine

## 2017-10-23 DIAGNOSIS — R42 Dizziness and giddiness: Secondary | ICD-10-CM

## 2017-10-25 ENCOUNTER — Encounter

## 2017-10-25 ENCOUNTER — Encounter: Payer: Self-pay | Admitting: Gastroenterology

## 2017-10-25 ENCOUNTER — Ambulatory Visit: Payer: BLUE CROSS/BLUE SHIELD | Admitting: Gastroenterology

## 2017-10-25 VITALS — BP 121/85 | HR 67 | Temp 98.3°F | Ht 63.0 in | Wt 216.4 lb

## 2017-10-25 DIAGNOSIS — K760 Fatty (change of) liver, not elsewhere classified: Secondary | ICD-10-CM | POA: Insufficient documentation

## 2017-10-25 DIAGNOSIS — Z8719 Personal history of other diseases of the digestive system: Secondary | ICD-10-CM | POA: Diagnosis not present

## 2017-10-25 DIAGNOSIS — K219 Gastro-esophageal reflux disease without esophagitis: Secondary | ICD-10-CM | POA: Diagnosis not present

## 2017-10-25 DIAGNOSIS — R198 Other specified symptoms and signs involving the digestive system and abdomen: Secondary | ICD-10-CM | POA: Insufficient documentation

## 2017-10-25 NOTE — Progress Notes (Signed)
Sandy Waters, I wanted patient to have fatty liver and IBS info but forgot to give it to her prior to her leaving office. Please print AVS and mail it to her.

## 2017-10-25 NOTE — Progress Notes (Signed)
Primary Care Physician:  Ignatius SpeckingVyas, Dhruv B, MD  Primary Gastroenterologist:  Jonette EvaSandi Fields, MD   Chief Complaint  Patient presents with  . fatty liver  . Gastroesophageal Reflux  . incomplete bm    and "stringy bm"    HPI:  Sandy Waters is a 52 y.o. female here at the request of Dr. Sherril CroonVyas for further evaluation of fatty liver.  Patient states she initially learned about fatty liver earlier this year.  Had an ultrasound performed in Dr. Bonnita LevanVyas's office.  We have requested records.  Her LFTs are normal.  She has intermittent right upper quadrant discomfort.  She has some today.  Mild and nagging already.  Not necessarily related to meals. Wake up with regurgitation/burning about 3 times a month. Was taking protonix every day but now only as needed.  Remote EGD in [place], reports hiatal hernia.  Reflux is definitely related to what she eats.  Tries to avoid trigger foods.  Occasional daytime symptoms as well.  No solid food dysphagia.  Patient has a bowel movement several times describes incomplete stool and has to go back several times.  May go once or twice later in the day.  Stools with mucus and somewhat stringy.  Generally soft to loose, does have solid stools.  No hard stools.  No melena or rectal bleeding.  No nocturnal stools.  Last colonoscopy about 5 years ago.  States it was normal.  Again performed in [place].  Over the past year she did note a change in her bowel habits, having repeated stools in the mornings.  Prior to that would have a single BM, complete stool.  Denies melena or rectal bleeding.  Rarely takes naproxen.  Takes Imodium when needed.   Current Outpatient Medications  Medication Sig Dispense Refill  . buPROPion (WELLBUTRIN XL) 300 MG 24 hr tablet Take by mouth daily.     . busPIRone (BUSPAR) 5 MG tablet Take by mouth as needed.     . diclofenac sodium (VOLTAREN) 1 % GEL Voltaren Gel 3 grams to 3 large joints upto TID 10 TUBES with 3 refills 5 Tube 0  . fluticasone  (FLONASE) 50 MCG/ACT nasal spray Place into both nostrils daily.    . naproxen (NAPROSYN) 500 MG tablet Take 500 mg by mouth as needed.     . pantoprazole (PROTONIX) 40 MG tablet Take 40 mg by mouth daily.  2  . traMADol (ULTRAM) 50 MG tablet Take 50 mg by mouth 3 (three) times daily as needed. For pain    . Vitamin D, Ergocalciferol, (DRISDOL) 50000 UNITS CAPS Take 50,000 Units by mouth every 7 (seven) days. Does not take on a specific day of the week     No current facility-administered medications for this visit.     Allergies as of 10/25/2017 - Review Complete 10/25/2017  Allergen Reaction Noted  . Macrodantin [nitrofurantoin macrocrystal] Swelling 03/11/2012  . Sulfa antibiotics Swelling 08/29/2008  . Latex Itching and Rash 03/11/2012    Past Medical History:  Diagnosis Date  . Acid reflux   . Anxiety   . Chronic low back pain   . Depression   . Fibromyalgia   . High cholesterol     Past Surgical History:  Procedure Laterality Date  . CESAREAN SECTION    . CHOLECYSTECTOMY    . COLONOSCOPY  2014   [place]: reportedly normal  . SHOULDER SURGERY Right 08/30/2016   bone spur/scar tissue removed   . TONSILLECTOMY      Family  History  Problem Relation Age of Onset  . Heart disease Mother   . Hypertension Mother   . Diabetes Mother   . Cancer Father        prostate   . Hypertension Sister   . Diabetes Sister   . Hypertension Brother   . Schizophrenia Brother   . Diabetes Brother   . Scoliosis Daughter   . Colon cancer Neg Hx   . Colon polyps Neg Hx     Social History   Socioeconomic History  . Marital status: Married    Spouse name: Not on file  . Number of children: Not on file  . Years of education: Not on file  . Highest education level: Not on file  Occupational History  . Not on file  Social Needs  . Financial resource strain: Not on file  . Food insecurity:    Worry: Not on file    Inability: Not on file  . Transportation needs:     Medical: Not on file    Non-medical: Not on file  Tobacco Use  . Smoking status: Former Smoker    Last attempt to quit: 02/28/1993    Years since quitting: 24.6  . Smokeless tobacco: Never Used  Substance and Sexual Activity  . Alcohol use: Yes    Frequency: Never    Comment: occ  . Drug use: Never  . Sexual activity: Not on file  Lifestyle  . Physical activity:    Days per week: Not on file    Minutes per session: Not on file  . Stress: Not on file  Relationships  . Social connections:    Talks on phone: Not on file    Gets together: Not on file    Attends religious service: Not on file    Active member of club or organization: Not on file    Attends meetings of clubs or organizations: Not on file    Relationship status: Not on file  . Intimate partner violence:    Fear of current or ex partner: Not on file    Emotionally abused: Not on file    Physically abused: Not on file    Forced sexual activity: Not on file  Other Topics Concern  . Not on file  Social History Narrative  . Not on file      ROS:  General: Negative for anorexia, weight loss, fever, chills, positive fatigue, weakness. Eyes: Negative for vision changes.  ENT: Negative for hoarseness, difficulty swallowing , nasal congestion. CV: Negative for chest pain, angina, palpitations, dyspnea on exertion, peripheral edema.  Respiratory: Negative for dyspnea at rest, dyspnea on exertion, cough, sputum, wheezing.  GI: See history of present illness. GU:  Negative for dysuria, hematuria, urinary incontinence, urinary frequency, nocturnal urination.  MS: Positive joint and back pain Derm: Negative for rash or itching.  Neuro: Negative for weakness, abnormal sensation, seizure, frequent headaches, memory loss, confusion.  Psych: Negative for anxiety, depression, suicidal ideation, hallucinations.  Endo: Negative for unusual weight change.  Heme: Negative for bruising or bleeding. Allergy: Negative for rash or  hives.    Physical Examination:  BP 121/85   Pulse 67   Temp 98.3 F (36.8 C) (Oral)   Ht 5\' 3"  (1.6 m)   Wt 216 lb 6.4 oz (98.2 kg)   BMI 38.33 kg/m    General: Well-nourished, well-developed in no acute distress.  Head: Normocephalic, atraumatic.   Eyes: Conjunctiva pink, no icterus. Mouth: Oropharyngeal mucosa moist and pink ,  no lesions erythema or exudate. Neck: Supple without thyromegaly, masses, or lymphadenopathy.  Lungs: Clear to auscultation bilaterally.  Heart: Regular rate and rhythm, no murmurs rubs or gallops.  Abdomen: Bowel sounds are normal, nontender, nondistended, no hepatosplenomegaly or masses, no abdominal bruits or    hernia , no rebound or guarding.  Limited due to body habitus Rectal: Not performed Extremities: No lower extremity edema. No clubbing or deformities.  Neuro: Alert and oriented x 4 , grossly normal neurologically.  Skin: Warm and dry, no rash or jaundice.   Psych: Alert and cooperative, normal mood and affect.  Labs: 04/2017: glucose 94, BUN 12, Cre 0.99, Tbili 0.2, AP 94, AST 18, ALT 21, alb 4.3, TSH 3.110, WBC 6900, H/H 14.3/41.4, platelets 360,000  Imaging Studies: No results found.

## 2017-10-25 NOTE — Progress Notes (Signed)
Mailed

## 2017-10-25 NOTE — Patient Instructions (Addendum)
Please have your labs done at [facility]. [address], [place]. [phone]. Please call one week after you have labs done if you have not heard results from us.   I will review your records and let you know next step.    Fatty Liver Fatty liver, also called hepatic steatosis or steatohepatitis, is a condition in which too much fat has built up in your liver cells. The liver removes harmful substances from your bloodstream. It produces fluids your body needs. It also helps your body use and store energy from the food you eat. In many cases, fatty liver does not cause symptoms or problems. It is often diagnosed when tests are being done for other reasons. However, over time, fatty liver can cause inflammation that may lead to more serious liver problems, such as scarring of the liver (cirrhosis). What are the causes? Causes of fatty liver may include:  Drinking too much alcohol.  Poor nutrition.  Obesity.  Cushing syndrome.  Diabetes.  Hyperlipidemia.  Pregnancy.  Certain drugs.  Poisons.  Some viral infections.  What increases the risk? You may be more likely to develop fatty liver if you:  Abuse alcohol.  Are pregnant.  Are overweight.  Have diabetes.  Have hepatitis.  Have a high triglyceride level.  What are the signs or symptoms? Fatty liver often does not cause any symptoms. In cases where symptoms develop, they can include:  Fatigue.  Weakness.  Weight loss.  Confusion.  Abdominal pain.  Yellowing of your skin and the white parts of your eyes (jaundice).  Nausea and vomiting.  How is this diagnosed? Fatty liver may be diagnosed by:  Physical exam and medical history.  Blood tests.  Imaging tests, such as an ultrasound, CT scan, or MRI.  Liver biopsy. A small sample of liver tissue is removed using a needle. The sample is then looked at under a microscope.  How is this treated? Fatty liver is often caused by other health  conditions. Treatment for fatty liver may involve medicines and lifestyle changes to manage conditions such as:  Alcoholism.  High cholesterol.  Diabetes.  Being overweight or obese.  Follow these instructions at home:  Eat a healthy diet as directed by your health care provider.  Exercise regularly. This can help you lose weight and control your cholesterol and diabetes. Talk to your health care provider about an exercise plan and which activities are best for you.  Do not drink alcohol.  Take medicines only as directed by your health care provider. Contact a health care provider if: You have difficulty controlling your:  Blood sugar.  Cholesterol.  Alcohol consumption.  Get help right away if:  You have abdominal pain.  You have jaundice.  You have nausea and vomiting. This information is not intended to replace advice given to you by your health care provider. Make sure you discuss any questions you have with your health care provider. Document Released: 04/01/2005 Document Revised: 07/23/2015 Document Reviewed: 06/26/2013 Elsevier Interactive Patient Education  2018 [employer]   Irritable Bowel Syndrome, Adult Irritable bowel syndrome (IBS) is not one specific disease. It is a group of symptoms that affects the organs responsible for digestion (gastrointestinal or GI tract). To regulate how your GI tract works, your body sends signals back and forth between your intestines and your brain. If you have IBS, there may be a problem with these signals. As a result, your GI tract does not function normally. Your intestines may become more  sensitive and overreact to certain things. This is especially true when you eat certain foods or when you are under stress. There are four types of IBS. These may be determined based on the consistency of your stool:  IBS with diarrhea.  IBS with constipation.  Mixed IBS.  Unsubtyped IBS.  It is important to know which type of  IBS you have. Some treatments are more likely to be helpful for certain types of IBS. What are the causes? The exact cause of IBS is not known. What increases the risk? You may have a higher risk of IBS if:  You are a woman.  You are younger than 54 years old.  You have a family history of IBS.  You have mental health problems.  You have had bacterial infection of your GI tract.  What are the signs or symptoms? Symptoms of IBS vary from person to person. The main symptom is abdominal pain or discomfort. Additional symptoms usually include one or more of the following:  Diarrhea, constipation, or both.  Abdominal swelling or bloating.  Feeling full or sick after eating a small or regular-size meal.  Frequent gas.  Mucus in the stool.  A feeling of having more stool left after a bowel movement.  Symptoms tend to come and go. They may be associated with stress, psychiatric conditions, or nothing at all. How is this diagnosed? There is no specific test to diagnose IBS. Your health care provider will make a diagnosis based on a physical exam, medical history, and your symptoms. You may have other tests to rule out other conditions that may be causing your symptoms. These may include:  Blood tests.  X-rays.  CT scan.  Endoscopy and colonoscopy. This is a test in which your GI tract is viewed with a long, thin, flexible tube.  How is this treated? There is no cure for IBS, but treatment can help relieve symptoms. IBS treatment often includes:  Changes to your diet, such as: ? Eating more fiber. ? Avoiding foods that cause symptoms. ? Drinking more water. ? Eating regular, medium-sized portioned meals.  Medicines. These may include: ? Fiber supplements if you have constipation. ? Medicine to control diarrhea (antidiarrheal medicines). ? Medicine to help control muscle spasms in your GI tract (antispasmodic medicines). ? Medicines to help with any mental health issues,  such as antidepressants or tranquilizers.  Therapy. ? Talk therapy may help with anxiety, depression, or other mental health issues that can make IBS symptoms worse.  Stress reduction. ? Managing your stress can help keep symptoms under control.  Follow these instructions at home:  Take medicines only as directed by your health care provider.  Eat a healthy diet. ? Avoid foods and drinks with added sugar. ? Include more whole grains, fruits, and vegetables gradually into your diet. This may be especially helpful if you have IBS with constipation. ? Avoid any foods and drinks that make your symptoms worse. These may include dairy products and caffeinated or carbonated drinks. ? Do not eat large meals. ? Drink enough fluid to keep your urine clear or pale yellow.  Exercise regularly. Ask your health care provider for recommendations of good activities for you.  Keep all follow-up visits as directed by your health care provider. This is important. Contact a health care provider if:  You have constant pain.  You have trouble or pain with swallowing.  You have worsening diarrhea. Get help right away if:  You have severe and worsening abdominal pain.  You have diarrhea and: ? You have a rash, stiff neck, or severe headache. ? You are irritable, sleepy, or difficult to awaken. ? You are weak, dizzy, or extremely thirsty.  You have bright red blood in your stool or you have black tarry stools.  You have unusual abdominal swelling that is painful.  You vomit continuously.  You vomit blood (hematemesis).  You have both abdominal pain and a fever. This information is not intended to replace advice given to you by your health care provider. Make sure you discuss any questions you have with your health care provider. Document Released: 02/14/2005 Document Revised: 07/17/2015 Document Reviewed: 11/01/2013 Elsevier Interactive Patient Education  2018 [employer].

## 2017-10-25 NOTE — Assessment & Plan Note (Signed)
Baseline IBS.  1 year history of feeling of incomplete stool, frequent BMs in the morning.  Suspect symptoms are related to IBS plus or minus medications.  We are requesting previous colonoscopy report.  We will not rule out any colonoscopy for change in bowel habits in the near future.  Labs as well.  Further recommendations to follow.

## 2017-10-25 NOTE — Assessment & Plan Note (Signed)
Meal related.  Symptoms intermittent, 2-3 times monthly.  Reinforced antireflux measures.  If she develops more frequent symptoms, occurring weekly, unable to control with diet she should consider taking Protonix on a daily basis.  Otherwise she can use quick-acting agent such as Tums or Zantac as needed.

## 2017-10-25 NOTE — Assessment & Plan Note (Signed)
She reports fatty liver recently seen on ultrasound.  Several years back she had a CT with probable fatty liver then.  Will obtain recent ultrasound report.  LFTs are normal.  Reinforced fatty liver recommendations.  Handout will be mailed to her as a forgot to give it to her before she left the office.  Plan to update LFTs yearly.  Instructions for fatty liver: Recommend 1-2# weight loss per week until ideal body weight through exercise & diet. Low fat/cholesterol diet.   Avoid sweets, sodas, fruit juices, sweetened beverages like tea, etc. Gradually increase exercise from 15 min daily up to 1 hr per day 5 days/week. Limit alcohol use.

## 2017-10-26 NOTE — Progress Notes (Signed)
cc'd to pcp 

## 2017-10-27 ENCOUNTER — Encounter: Payer: Self-pay | Admitting: Gastroenterology

## 2017-10-27 ENCOUNTER — Telehealth: Payer: Self-pay | Admitting: Gastroenterology

## 2017-10-27 NOTE — Telephone Encounter (Signed)
Reviewed records from [place].  EGD 11/08/2013 by Dr. Amado CoeFein, normal.  Proximal and distal esophageal biopsies benign with no increased intraepithelial eosinophils.  No intestinal metaplasia.  Gastric biopsy showed chronic active gastritis, no H. pylori.  EGD August 2012 by Dr. Joycelyn SchmidSarah Lentz showed bile reflux and small hiatal hernia.  Colonoscopy April 2008 by Dr. Verlin DikeLouis D'Oro showed a few scattered ulcerations in the lower rectum likely related to prep, exceptionally tortuous sigmoid colon, difficult to pass on exam completed to the ileocecal valve.  Couple of diverticula in the sigmoid colon.  EGD January 2007 by Dr. Verlin DikeLouis D'Oro, gastritis mostly in the antrum, superficial ulcerations in the stomach.  Mild duodenitis, hiatal hernia.  Stomach biopsies unremarkable.  Geimsa stain negative for organisms.  Labs from October 26, 2017 White blood cell count 6600, hemoglobin 14.4, hematocrit 44.5, MCV 92.1, platelets 371,000, total bilirubin 0.4, alkaline phosphatase 100, AST 10, ALT 23, albumin 3.8.   PLEASE LET PATIENT KNOW THAT WE RECEIVED EGDS AND ONE COLONOSCOPY (2008). SHE BELIEVES SHE HAD ONE 5 YEARS AGO, WE WERE NOT ABLE TO GET THAT ONE. HER LABS LOOK GOOD, NO ANEMIA AND LFTS NORMAL.   STILL WAITING FOR U/S REPORT FROM PCP.   WOULD OFFER TCS WITH [facility] WITH PROPOFOL FOR CHANGE IN BOWEL HABITS.

## 2017-10-29 ENCOUNTER — Ambulatory Visit
Admission: RE | Admit: 2017-10-29 | Discharge: 2017-10-29 | Disposition: A | Discharge status: Home / Self Care | Payer: Self-pay | Source: Ambulatory Visit | Attending: Internal Medicine | Admitting: Internal Medicine

## 2017-10-29 DIAGNOSIS — R42 Dizziness and giddiness: Secondary | ICD-10-CM

## 2017-10-29 MED ORDER — GADOBENATE DIMEGLUMINE 529 MG/ML IV SOLN
20.0000 mL | Freq: Once | INTRAVENOUS | Status: AC | PRN
  Administered 2017-10-29: 20 mL via INTRAVENOUS

## 2017-10-31 NOTE — Telephone Encounter (Signed)
Pt is aware and she wants to check out insurance to see how much it would cover. Then she will let us know what she wants to do.

## 2017-11-02 NOTE — Telephone Encounter (Signed)
Received copy of ultrasound from March 2019.  Liver demonstrated fatty infiltration.  Poor penetration of the ultrasound being limited evaluation somewhat.  Gallbladder absent.  Common bile duct 3 mm.  For fatty liver, she needs to have lfts in one year.

## 2017-11-03 ENCOUNTER — Other Ambulatory Visit: Payer: Self-pay

## 2017-11-03 DIAGNOSIS — R945 Abnormal results of liver function studies: Secondary | ICD-10-CM

## 2017-11-03 DIAGNOSIS — R7989 Other specified abnormal findings of blood chemistry: Secondary | ICD-10-CM

## 2017-11-03 NOTE — Telephone Encounter (Signed)
Pt is aware.  Lab orders on file for one year. I mailed her a handout on Instructions for a fatty liver.

## 2017-12-19 NOTE — Progress Notes (Signed)
Office Visit Note  Patient: Sandy Waters             Date of Birth: 1965-07-21           MRN: [id number]             PCP: Patient, No Pcp Per Referring: No ref. provider found Visit Date: 12/20/2017 Occupation: @GUAROCC @  Subjective:  Neck and lower back pain.   History of Present Illness: Sandy Waters is a 52 y.o. female of fibromyalgia osteoarthritis and disc disease.  She states she has been experiencing a lot of neck and shoulder pain.  She was seen by Dr. Rennis Chris who did MRI of her cervical spine.  According to patient it showed arthritis.  She also continues to have lower back pain and she has underlying disc disease.  She continues to have discomfort from fibromyalgia as well.  Her knee joints are bothersome.  Activities of Daily Living:  Patient reports morning stiffness for 1 hour.   Patient Reports nocturnal pain.  Difficulty dressing/grooming: Reports Difficulty climbing stairs: Reports Difficulty getting out of chair: Reports Difficulty using hands for taps, buttons, cutlery, and/or writing: Reports  Review of Systems  Constitutional: Positive for fatigue. Negative for activity change, night sweats, weight gain and weight loss.  HENT: Negative for mouth sores, trouble swallowing, trouble swallowing, mouth dryness and nose dryness.   Eyes: Negative for pain, redness, itching, visual disturbance and dryness.  Respiratory: Negative for cough, shortness of breath, wheezing and difficulty breathing.   Cardiovascular: Negative for chest pain, palpitations, hypertension, irregular heartbeat and swelling in legs/feet.  Gastrointestinal: Positive for constipation, diarrhea and nausea. Negative for abdominal pain, blood in stool and vomiting.       History of IBS  Endocrine: Negative for increased urination.  Genitourinary: Negative for painful urination, nocturia, pelvic pain and vaginal dryness.  Musculoskeletal: Positive for arthralgias, joint pain, myalgias, morning  stiffness and myalgias. Negative for joint swelling, muscle weakness and muscle tenderness.  Skin: Negative for color change, rash, hair loss, skin tightness, ulcers and sensitivity to sunlight.  Allergic/Immunologic: Negative for susceptible to infections.  Neurological: Positive for headaches and memory loss. Negative for dizziness, light-headedness, night sweats and weakness.  Hematological: Negative for bruising/bleeding tendency and swollen glands.  Psychiatric/Behavioral: Positive for depressed mood and sleep disturbance. Negative for confusion. The patient is nervous/anxious.     PMFS History:  Patient Active Problem List   Diagnosis Date Noted  . Acid reflux 10/25/2017  . Change in bowel function 10/25/2017  . Fatty liver 10/25/2017  . Fibromyalgia 09/21/2017  . Other insomnia 09/21/2017  . Other fatigue 09/21/2017  . Carpal tunnel syndrome, left upper limb 09/21/2017  . 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 Primary osteoarthritis of both knees 09/21/2017  . DDD (degenerative disc disease), lumbar 09/21/2017  . Anxiety and depression 09/21/2017  . History of IBS 09/21/2017  . History of migraine 09/21/2017  . High cholesterol 09/21/2017  . History of stomach ulcers 09/21/2017    Past Medical History:  Diagnosis Date  . Acid reflux   . Anxiety   . Chronic low back pain   . Depression   . Fibromyalgia   . High cholesterol   . IBS (irritable bowel syndrome)     Family History  Problem Relation Age of Onset  . Heart disease Mother   . Hypertension Mother   . Diabetes Mother   . Cancer Father        prostate   .  Hypertension Sister   . Diabetes Sister   . Hypertension Brother   . Schizophrenia Brother   . Diabetes Brother   . Scoliosis Daughter   . Colon cancer Neg Hx   . Colon polyps Neg Hx    Past Surgical History:  Procedure Laterality Date  . CESAREAN SECTION    . CHOLECYSTECTOMY    . COLONOSCOPY  2014   ?2014 AND REPORTEDLY NORMAL  . COLONOSCOPY   05/2006   Dr. Lissa Hoard D'Oro: few scattered ulcerations in lower rectum likely related to prep, toruous sigmoid colon difficult to pass, few diverticula in sigmoid colon.   . ESOPHAGOGASTRODUODENOSCOPY  11/08/2013   Dr. Amado Coe: normal. biopsies from proximal and distal esophagus with no increased intraepithelial eosinophils, no intestinal metaplasia. gastric bx with chronic active gastritis, no H.pylori  . ESOPHAGOGASTRODUODENOSCOPY  09/2010   Dr. Joycelyn Schmid: bile reflux, small hiatal hernia  . ESOPHAGOGASTRODUODENOSCOPY  02/2005   Dr. Lissa Hoard D'Oro: gastritis mostly in the antrum, superficial ulcerations in the stomach, mild duodenitis, hiatal hernia. gastric bx negative.  Marland Kitchen SHOULDER SURGERY Right 08/30/2016   bone spur/scar tissue removed   . TONSILLECTOMY     Social History   Social History Narrative  . Not on file    Objective: Vital Signs: BP 136/90 (BP Location: Left Arm, Patient Position: Sitting, Cuff Size: Large)   Pulse 70   Resp 14   Ht 5\' 3"  (1.6 m)   Wt 218 lb (98.9 kg)   BMI 38.62 kg/m    Physical Exam  Constitutional: She is oriented to person, place, and time. She appears well-developed and well-nourished.  HENT:  Head: Normocephalic and atraumatic.  Eyes: Conjunctivae and EOM are normal.  Neck: Normal range of motion.  Cardiovascular: Normal rate, regular rhythm, normal heart sounds and intact distal pulses.  Pulmonary/Chest: Effort normal and breath sounds normal.  Abdominal: Soft. Bowel sounds are normal.  Lymphadenopathy:    She has no cervical adenopathy.  Neurological: She is alert and oriented to person, place, and time.  Skin: Skin is warm and dry. Capillary refill takes less than 2 seconds.  Psychiatric: She has a normal mood and affect. Her behavior is normal.  Nursing note and vitals reviewed.    Musculoskeletal Exam: C-spine painful range of motion with bilateral trapezius spasm.  She has limited range of motion of her lumbar spine.  She continues to  have some discomfort over right SI joint.  Shoulder joints elbow joints wrist joint MCPs PIPs DIPs were in good range of motion with no synovitis.  Hip joints knee joints ankles MTPs PIPs DIPs were in good range of motion with no synovitis.  She has some generalized hyperalgesia and positive tender points.  CDAI Exam: CDAI Score: Not documented Patient Global Assessment: Not documented; Provider Global Assessment: Not documented Swollen: Not documented; Tender: Not documented Joint Exam   Not documented   There is currently no information documented on the homunculus. Go to the Rheumatology activity and complete the homunculus joint exam.  Investigation: No additional findings.  Imaging: No results found.  Recent Labs: Lab Results  Component Value Date   WBC 5.4 03/11/2012   HGB 13.9 03/11/2012   PLT 304 03/11/2012   NA 142 03/11/2012   K 3.7 03/11/2012   CL 107 03/11/2012   CO2 23 03/11/2012   GLUCOSE 103 (H) 03/11/2012   BUN 9 03/11/2012   CREATININE 0.78 03/11/2012   CALCIUM 9.8 03/11/2012   GFRAA >90 03/11/2012    Speciality Comments:  No specialty comments available.  Procedures:  Trigger Point Inj Date/Time: 12/20/2017 11:26 AM Performed by: Pollyann Savoy, MD Authorized by: Pollyann Savoy, MD   Consent Given by:  Patient Site marked: the procedure site was marked   Timeout: prior to procedure the correct patient, procedure, and site was verified   Indications:  Muscle spasm and pain Total # of Trigger Points:  2 Location: neck   Needle Size:  27 G Approach:  Dorsal Medications #1:  0.5 mL lidocaine 1 %; 10 mg triamcinolone acetonide 40 MG/ML Medications #2:  0.5 mL lidocaine 1 %; 10 mg triamcinolone acetonide 40 MG/ML Patient tolerance:  Patient tolerated the procedure well with no immediate complications   Allergies: Macrodantin [nitrofurantoin macrocrystal]; Sulfa antibiotics; and Latex   Assessment / Plan:     Visit Diagnoses: Neck  pain-patient complains of neck pain and stiffness.  She had bilateral trapezius spasm.  She states she had recent MRI of her cervical spine at [facility] which revealed some degenerative changes.  I do not have MRI results available.  Per her request after informed consent was obtained bilateral trapezius area were injected with cortisone she tolerated the procedure well.  I offered physical therapy which she declined.  I have advised her to try water aerobics.  Fibromyalgia-she continues to have some generalized pain and discomfort.  She is on multiple medications as listed.  Other insomnia-sleep hygiene was discussed.  Other fatigue-she relates fatigue to insomnia and fibromyalgia.  Chronic right SI joint pain-stretching exercises were discussed.  Primary osteoarthritis of both knees-weight loss and exercise will be helpful.  DDD (degenerative disc disease), lumbar -  She had a MRI of her lumbar spine on 12/11/14  Carpal tunnel syndrome, left upper limb - She had a NCV with EMG performed on 04/06/17.  The study revealed a borderline to very mild median nerve entrapment at the left wrist. recommended brace  Anxiety and depression  History of IBS  History of migraine   Orders: Orders Placed This Encounter  Procedures  . Trigger Point Inj   No orders of the defined types were placed in this encounter.   Face-to-face time spent with patient was 30 minutes. Greater than 50% of time was spent in counseling and coordination of care.  Follow-Up Instructions: Return in about 6 months (around 06/21/2018) for FMS, OA, DDD.   Pollyann Savoy, MD  Note - This record has been created using Animal nutritionist.  Chart creation errors have been sought, but may not always  have been located. Such creation errors do not reflect on  the standard of medical care.

## 2017-12-20 ENCOUNTER — Ambulatory Visit: Payer: BLUE CROSS/BLUE SHIELD | Admitting: Rheumatology

## 2017-12-20 ENCOUNTER — Encounter: Payer: Self-pay | Admitting: Rheumatology

## 2017-12-20 VITALS — BP 136/90 | HR 70 | Resp 14 | Ht 63.0 in | Wt 218.0 lb

## 2017-12-20 DIAGNOSIS — F329 Major depressive disorder, single episode, unspecified: Secondary | ICD-10-CM

## 2017-12-20 DIAGNOSIS — Z8719 Personal history of other diseases of the digestive system: Secondary | ICD-10-CM

## 2017-12-20 DIAGNOSIS — G4709 Other insomnia: Secondary | ICD-10-CM

## 2017-12-20 DIAGNOSIS — M5136 Other intervertebral disc degeneration, lumbar region: Secondary | ICD-10-CM

## 2017-12-20 DIAGNOSIS — M797 Fibromyalgia: Secondary | ICD-10-CM

## 2017-12-20 DIAGNOSIS — G5602 Carpal tunnel syndrome, left upper limb: Secondary | ICD-10-CM

## 2017-12-20 DIAGNOSIS — M542 Cervicalgia: Secondary | ICD-10-CM | POA: Diagnosis not present

## 2017-12-20 DIAGNOSIS — G8929 Other chronic pain: Secondary | ICD-10-CM

## 2017-12-20 DIAGNOSIS — F419 Anxiety disorder, unspecified: Secondary | ICD-10-CM

## 2017-12-20 DIAGNOSIS — M170 Bilateral primary osteoarthritis of knee: Secondary | ICD-10-CM

## 2017-12-20 DIAGNOSIS — R5383 Other fatigue: Secondary | ICD-10-CM | POA: Diagnosis not present

## 2017-12-20 DIAGNOSIS — Z8669 Personal history of other diseases of the nervous system and sense organs: Secondary | ICD-10-CM

## 2017-12-20 DIAGNOSIS — M533 Sacrococcygeal disorders, not elsewhere classified: Secondary | ICD-10-CM

## 2017-12-20 MED ORDER — LIDOCAINE HCL 1 % IJ SOLN
0.5000 mL | INTRAMUSCULAR | Status: AC | PRN
  Administered 2017-12-20: .5 mL

## 2017-12-20 MED ORDER — TRIAMCINOLONE ACETONIDE 40 MG/ML IJ SUSP
10.0000 mg | INTRAMUSCULAR | Status: AC | PRN
  Administered 2017-12-20: 10 mg via INTRAMUSCULAR

## 2018-02-26 NOTE — Progress Notes (Signed)
Office Visit Note  Patient: Sandy Waters             Date of Birth: 05/23/1965           MRN: [id number]             PCP: Patient, No Pcp Per Referring: No ref. provider found Visit Date: 03/12/2018 Occupation: @GUAROCC @  Subjective:  Pain in both hands   History of Present Illness: Sandy Waters is a 52 y.o. female history of fibromyalgia and osteoarthritis.  She states recently she has been having increasing stiffness in her hands.  She has been also having some neck pain and upper back pain.  She also describes some pain in the costochondral region.  Her knee joints continue to hurt.  He has noticed possible swelling in her hands.  She is having a flare of fibromyalgia with generalized pain.  Activities of Daily Living:  Patient reports morning stiffness for 1 hour.   Patient Reports nocturnal pain.  Difficulty dressing/grooming: Denies Difficulty climbing stairs: Reports Difficulty getting out of chair: Denies Difficulty using hands for taps, buttons, cutlery, and/or writing: Denies  Review of Systems  Constitutional: Positive for fatigue. Negative for night sweats, weight gain and weight loss.  HENT: Negative for mouth sores, trouble swallowing, trouble swallowing, mouth dryness and nose dryness.   Eyes: Positive for dryness. Negative for pain, redness and visual disturbance.  Respiratory: Negative for cough, shortness of breath and difficulty breathing.   Cardiovascular: Negative for chest pain, palpitations, hypertension, irregular heartbeat and swelling in legs/feet.  Gastrointestinal: Negative for blood in stool, constipation and diarrhea.  Endocrine: Negative for increased urination.  Genitourinary: Negative for vaginal dryness.  Musculoskeletal: Positive for arthralgias, joint pain, myalgias, morning stiffness and myalgias. Negative for joint swelling, muscle weakness and muscle tenderness.  Skin: Negative for color change, rash, hair loss, skin tightness,  ulcers and sensitivity to sunlight.  Allergic/Immunologic: Negative for susceptible to infections.  Neurological: Negative for dizziness, memory loss, night sweats and weakness.  Hematological: Negative for swollen glands.  Psychiatric/Behavioral: Positive for depressed mood and sleep disturbance. The patient is nervous/anxious.     PMFS History:  Patient Active Problem List   Diagnosis Date Noted  . Acid reflux 10/25/2017  . Change in bowel function 10/25/2017  . Fatty liver 10/25/2017  . Fibromyalgia 09/21/2017  . Other insomnia 09/21/2017  . Other fatigue 09/21/2017  . Carpal tunnel syndrome, left upper limb 09/21/2017  . 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 Primary osteoarthritis of both knees 09/21/2017  . DDD (degenerative disc disease), lumbar 09/21/2017  . Anxiety and depression 09/21/2017  . History of IBS 09/21/2017  . History of migraine 09/21/2017  . High cholesterol 09/21/2017  . History of stomach ulcers 09/21/2017    Past Medical History:  Diagnosis Date  . Acid reflux   . Anxiety   . Chronic low back pain   . Depression   . Fibromyalgia   . High cholesterol   . IBS (irritable bowel syndrome)     Family History  Problem Relation Age of Onset  . Heart disease Mother   . Hypertension Mother   . Diabetes Mother   . Breast cancer Mother   . Cancer Father        prostate   . Hypertension Sister   . Diabetes Sister   . Hypertension Brother   . Schizophrenia Brother   . Diabetes Brother   . Scoliosis Daughter   . Colon cancer  Neg Hx   . Colon polyps Neg Hx    Past Surgical History:  Procedure Laterality Date  . CESAREAN SECTION    . CHOLECYSTECTOMY    . COLONOSCOPY  2014   ?2014 AND REPORTEDLY NORMAL  . COLONOSCOPY  05/2006   Dr. Lissa HoardLouis D'Oro: few scattered ulcerations in lower rectum likely related to prep, toruous sigmoid colon difficult to pass, few diverticula in sigmoid colon.   . ESOPHAGOGASTRODUODENOSCOPY  11/08/2013   Dr. Amado CoeFein: normal.  biopsies from proximal and distal esophagus with no increased intraepithelial eosinophils, no intestinal metaplasia. gastric bx with chronic active gastritis, no H.pylori  . ESOPHAGOGASTRODUODENOSCOPY  09/2010   Dr. Joycelyn SchmidSarah Lentz: bile reflux, small hiatal hernia  . ESOPHAGOGASTRODUODENOSCOPY  02/2005   Dr. Lissa HoardLouis D'Oro: gastritis mostly in the antrum, superficial ulcerations in the stomach, mild duodenitis, hiatal hernia. gastric bx negative.  Marland Kitchen. SHOULDER SURGERY Right 08/30/2016   bone spur/scar tissue removed   . TONSILLECTOMY     Social History   Social History Narrative  . Not on file    Objective: Vital Signs: BP (!) 141/89 (BP Location: Left Wrist, Patient Position: Sitting, Cuff Size: Normal)   Pulse 60   Resp 12   Ht 5\' 2"  (1.575 m)   Wt 220 lb 12.8 oz (100.2 kg)   BMI 40.38 kg/m    Physical Exam Vitals signs and nursing note reviewed.  Constitutional:      Appearance: She is well-developed.  HENT:     Head: Normocephalic and atraumatic.  Eyes:     Conjunctiva/sclera: Conjunctivae normal.  Neck:     Musculoskeletal: Normal range of motion.  Cardiovascular:     Rate and Rhythm: Normal rate and regular rhythm.     Heart sounds: Normal heart sounds.  Pulmonary:     Effort: Pulmonary effort is normal.     Breath sounds: Normal breath sounds.  Abdominal:     General: Bowel sounds are normal.     Palpations: Abdomen is soft.  Lymphadenopathy:     Cervical: No cervical adenopathy.  Skin:    General: Skin is warm and dry.     Capillary Refill: Capillary refill takes less than 2 seconds.  Neurological:     Mental Status: She is alert and oriented to person, place, and time.  Psychiatric:        Behavior: Behavior normal.      Musculoskeletal Exam: She has good range of motion of cervical spine with discomfort.  She had bilateral trapezius spasm.  She has discomfort range of motion of her lumbar spine.  Shoulder joints elbow joints wrist joints been good range of  motion.  She has tenderness across PIP joints.  Hip joints with good range of motion.  Knee joints with good range of motion without any warmth swelling or effusion but she had discomfort range of motion of bilateral knee joints.  Ankle joints MTPs PIPs were in good range of motion.  She has generalized hyperalgesia and positive tender points.  CDAI Exam: CDAI Score: Not documented Patient Global Assessment: Not documented; Provider Global Assessment: Not documented Swollen: Not documented; Tender: Not documented Joint Exam   Not documented   There is currently no information documented on the homunculus. Go to the Rheumatology activity and complete the homunculus joint exam.  Investigation: No additional findings.  Imaging: Xr Cervical Spine 2 Or 3 Views  Result Date: 03/12/2018 No significant disc space narrowing was noted.  Mild facet joint arthropathy was noted.  Xr Hand  2 View Left  Result Date: 03/12/2018 Minimal PIP joint space narrowing was noted.  CMC narrowing was noted.  No MCP, intercarpal radiocarpal joint space narrowing was noted.  No erosive changes were noted. Impression: These findings are consistent with mild osteoarthritis of the hand.  Xr Hand 2 View Right  Result Date: 03/12/2018 Minimal PIP joint space narrowing was noted.  CMC narrowing was noted.  No MCP, intercarpal radiocarpal joint space narrowing was noted.  No erosive changes were noted. Impression: These findings are consistent with mild osteoarthritis of the hand.  Xr Knee 3 View Left  Result Date: 03/12/2018 Mild medial compartment narrowing was noted.  Mild patellofemoral narrowing was noted.  No chondrocalcinosis was noted. Impression: These findings are consistent with mild osteoarthritis and mild chondromalacia patella.  Xr Knee 3 View Right  Result Date: 03/12/2018 Mild medial compartment narrowing was noted.  Mild patellofemoral narrowing was noted.  No chondrocalcinosis was noted. Impression:  These findings are consistent with mild osteoarthritis and mild chondromalacia patella.   Recent Labs: Lab Results  Component Value Date   WBC 5.4 03/11/2012   HGB 13.9 03/11/2012   PLT 304 03/11/2012   NA 142 03/11/2012   K 3.7 03/11/2012   CL 107 03/11/2012   CO2 23 03/11/2012   GLUCOSE 103 (H) 03/11/2012   BUN 9 03/11/2012   CREATININE 0.78 03/11/2012   CALCIUM 9.8 03/11/2012   GFRAA >90 03/11/2012    Speciality Comments: No specialty comments available.  Procedures:  Trigger Point Inj Date/Time: 03/12/2018 11:36 AM Performed by: Pollyann Savoy, MD Authorized by: Pollyann Savoy, MD   Consent Given by:  Patient Indications:  Muscle spasm and therapeutic Total # of Trigger Points:  2 Needle Size:  27 G Approach:  Dorsal Medications #1:  0.5 mL lidocaine 1 %; 10 mg triamcinolone acetonide 40 MG/ML Medications #2:  0.5 mL lidocaine 1 %; 10 mg triamcinolone acetonide 40 MG/ML   Allergies: Macrodantin [nitrofurantoin macrocrystal]; Sulfa antibiotics; and Latex   Assessment / Plan:     Visit Diagnoses: Fibromyalgia-patient is having a flare with generalized pain and discomfort and positive tender points.  Other fatigue-related to insomnia and fibromyalgia.  Other insomnia-she has primary insomnia.  Neck pain -she has been experiencing increased neck and thoracic pain.  X-ray was unremarkable.  She had significant trapezius spasm.  After informed consent was obtained bilateral trapezius area were injected with cortisone as described above.  A handout on neck exercises was given.  Plan: XR Cervical Spine 2 or 3 views  Pain in both hands -experiencing increased pain and discomfort in her hands.  No synovitis was noted.  X-rays were consistent with mild osteoarthritis of the hand.  Plan: XR Hand 2 View Right, XR Hand 2 View Left  Chronic pain of both knees -she complains of increased knee joint discomfort.  X-ray showed mild osteoarthritis and mild chondromalacia  patella.  Handout on knee joint exercises was given.  Plan: XR KNEE 3 VIEW RIGHT, XR KNEE 3 VIEW LEFT  Primary osteoarthritis of both knees  Carpal tunnel syndrome, left upper limb - She had a NCV with EMG performed on 04/06/17.  The study revealed a borderline to very mild median nerve entrapment at the left wrist. recommended brace  DDD (degenerative disc disease), lumbar-she has chronic lower back discomfort.  History of migraine  Anxiety and depression  History of IBS   Orders: Orders Placed This Encounter  Procedures  . XR Hand 2 View Right  . XR Hand  2 View Left  . XR KNEE 3 VIEW RIGHT  . XR KNEE 3 VIEW LEFT  . XR Cervical Spine 2 or 3 views   No orders of the defined types were placed in this encounter.   Face-to-face time spent with patient was 30 minutes. Greater than 50% of time was spent in counseling and coordination of care.  Follow-Up Instructions: Return in about 6 months (around 09/10/2018) for FMS, OA.   Pollyann Savoy, MD  Note - This record has been created using Animal nutritionist.  Chart creation errors have been sought, but may not always  have been located. Such creation errors do not reflect on  the standard of medical care.

## 2018-03-12 ENCOUNTER — Ambulatory Visit (INDEPENDENT_AMBULATORY_CARE_PROVIDER_SITE_OTHER): Payer: Self-pay

## 2018-03-12 ENCOUNTER — Ambulatory Visit: Payer: BLUE CROSS/BLUE SHIELD | Admitting: Rheumatology

## 2018-03-12 ENCOUNTER — Encounter: Payer: Self-pay | Admitting: Physician Assistant

## 2018-03-12 VITALS — BP 141/89 | HR 60 | Resp 12 | Ht 62.0 in | Wt 220.8 lb

## 2018-03-12 DIAGNOSIS — M79641 Pain in right hand: Secondary | ICD-10-CM | POA: Diagnosis not present

## 2018-03-12 DIAGNOSIS — M51369 Other intervertebral disc degeneration, lumbar region without mention of lumbar back pain or lower extremity pain: Secondary | ICD-10-CM

## 2018-03-12 DIAGNOSIS — G4709 Other insomnia: Secondary | ICD-10-CM

## 2018-03-12 DIAGNOSIS — M797 Fibromyalgia: Secondary | ICD-10-CM

## 2018-03-12 DIAGNOSIS — Z8669 Personal history of other diseases of the nervous system and sense organs: Secondary | ICD-10-CM

## 2018-03-12 DIAGNOSIS — M25561 Pain in right knee: Secondary | ICD-10-CM

## 2018-03-12 DIAGNOSIS — R5383 Other fatigue: Secondary | ICD-10-CM | POA: Diagnosis not present

## 2018-03-12 DIAGNOSIS — M5136 Other intervertebral disc degeneration, lumbar region: Secondary | ICD-10-CM

## 2018-03-12 DIAGNOSIS — M79642 Pain in left hand: Secondary | ICD-10-CM | POA: Diagnosis not present

## 2018-03-12 DIAGNOSIS — M542 Cervicalgia: Secondary | ICD-10-CM | POA: Diagnosis not present

## 2018-03-12 DIAGNOSIS — M25562 Pain in left knee: Secondary | ICD-10-CM | POA: Diagnosis not present

## 2018-03-12 DIAGNOSIS — F329 Major depressive disorder, single episode, unspecified: Secondary | ICD-10-CM

## 2018-03-12 DIAGNOSIS — Z8719 Personal history of other diseases of the digestive system: Secondary | ICD-10-CM

## 2018-03-12 DIAGNOSIS — M170 Bilateral primary osteoarthritis of knee: Secondary | ICD-10-CM

## 2018-03-12 DIAGNOSIS — G8929 Other chronic pain: Secondary | ICD-10-CM

## 2018-03-12 DIAGNOSIS — G5602 Carpal tunnel syndrome, left upper limb: Secondary | ICD-10-CM

## 2018-03-12 DIAGNOSIS — F419 Anxiety disorder, unspecified: Secondary | ICD-10-CM

## 2018-03-12 MED ORDER — LIDOCAINE HCL 1 % IJ SOLN
0.5000 mL | INTRAMUSCULAR | Status: AC | PRN
  Administered 2018-03-12: .5 mL

## 2018-03-12 MED ORDER — TRIAMCINOLONE ACETONIDE 40 MG/ML IJ SUSP
10.0000 mg | INTRAMUSCULAR | Status: AC | PRN
  Administered 2018-03-12: 10 mg via INTRAMUSCULAR

## 2018-03-12 NOTE — Patient Instructions (Signed)
Knee Exercises                        Ask your health care provider which exercises are safe for you. Do exercises exactly as told by your health care provider and adjust them as directed. It is normal to feel mild stretching, pulling, tightness, or discomfort as you do these exercises, but you should stop right away if you feel sudden pain or your pain gets worse.Do not begin these exercises until told by your health care provider.  STRETCHING AND RANGE OF MOTION EXERCISES  These exercises warm up your muscles and joints and improve the movement and flexibility of your knee. These exercises also help to relieve pain, numbness, and tingling.  Exercise A: Knee Extension, Prone  1. Lie on your abdomen on a bed.  2. Place your left / right knee just beyond the edge of the surface so your knee is not on the bed. You can put a towel under your left / right thigh just above your knee for comfort.  3. Relax your leg muscles and allow gravity to straighten your knee. You should feel a stretch behind your left / right knee.  4. Hold this position for __________ seconds.  5. Scoot up so your knee is supported between repetitions.  Repeat __________ times. Complete this stretch __________ times a day.  Exercise B: Knee Flexion, Active  1. Lie on your back with both knees straight. If this causes back discomfort, bend your left / right knee so your foot is flat on the floor.  2. Slowly slide your left / right heel back toward your buttocks until you feel a gentle stretch in the front of your knee or thigh.  3. Hold this position for __________ seconds.  4. Slowly slide your left / right heel back to the starting position.  Repeat __________ times. Complete this exercise __________ times a day.  Exercise C: Quadriceps, Prone  1. Lie on your abdomen on a firm surface, such as a bed or padded floor.  2. Bend your left / right knee and hold your ankle. If you cannot reach your ankle or pant leg, loop a belt around your foot and  grab the belt instead.  3. Gently pull your heel toward your buttocks. Your knee should not slide out to the side. You should feel a stretch in the front of your thigh and knee.  4. Hold this position for __________ seconds.  Repeat __________ times. Complete this stretch __________ times a day.  Exercise D: Hamstring, Supine  1. Lie on your back.  2. Loop a belt or towel over the ball of your left / right foot. The ball of your foot is on the walking surface, right under your toes.  3. Straighten your left / right knee and slowly pull on the belt to raise your leg until you feel a gentle stretch behind your knee.  ? Do not let your left / right knee bend while you do this.  ? Keep your other leg flat on the floor.  4. Hold this position for __________ seconds.  Repeat __________ times. Complete this stretch __________ times a day.  STRENGTHENING EXERCISES  These exercises build strength and endurance in your knee. Endurance is the ability to use your muscles for a long time, even after they get tired.  Exercise E: Quadriceps, Isometric  1. Lie on your back with your left / right leg extended and your   other knee bent. Put a rolled towel or small pillow under your knee if told by your health care provider.  2. Slowly tense the muscles in the front of your left / right thigh. You should see your kneecap slide up toward your hip or see increased dimpling just above the knee. This motion will push the back of the knee toward the floor.  3. For __________ seconds, keep the muscle as tight as you can without increasing your pain.  4. Relax the muscles slowly and completely.  Repeat __________ times. Complete this exercise __________ times a day.  Exercise F: Straight Leg Raises - Quadriceps  1. Lie on your back with your left / right leg extended and your other knee bent.  2. Tense the muscles in the front of your left / right thigh. You should see your kneecap slide up or see increased dimpling just above the knee. Your  thigh may even shake a bit.  3. Keep these muscles tight as you raise your leg 4-6 inches (10-15 cm) off the floor. Do not let your knee bend.  4. Hold this position for __________ seconds.  5. Keep these muscles tense as you lower your leg.  6. Relax your muscles slowly and completely after each repetition.  Repeat __________ times. Complete this exercise __________ times a day.  Exercise G: Hamstring, Isometric  1. Lie on your back on a firm surface.  2. Bend your left / right knee approximately __________ degrees.  3. Dig your left / right heel into the surface as if you are trying to pull it toward your buttocks. Tighten the muscles in the back of your thighs to dig as hard as you can without increasing any pain.  4. Hold this position for __________ seconds.  5. Release the tension gradually and allow your muscles to relax completely for __________ seconds after each repetition.  Repeat __________ times. Complete this exercise __________ times a day.  Exercise H: Hamstring Curls  If told by your health care provider, do this exercise while wearing ankle weights. Begin with __________ weights. Then increase the weight by 1 lb (0.5 kg) increments. Do not wear ankle weights that are more than __________.  1. Lie on your abdomen with your legs straight.  2. Bend your left / right knee as far as you can without feeling pain. Keep your hips flat against the floor.  3. Hold this position for __________ seconds.  4. Slowly lower your leg to the starting position.  Repeat __________ times. Complete this exercise __________ times a day.  Exercise I: Squats (Quadriceps)  1. Stand in front of a table, with your feet and knees pointing straight ahead. You may rest your hands on the table for balance but not for support.  2. Slowly bend your knees and lower your hips like you are going to sit in a chair.  ? Keep your weight over your heels, not over your toes.  ? Keep your lower legs upright so they are parallel with the  table legs.  ? Do not let your hips go lower than your knees.  ? Do not bend lower than told by your health care provider.  ? If your knee pain increases, do not bend as low.  3. Hold the squat position for __________ seconds.  4. Slowly push with your legs to return to standing. Do not use your hands to pull yourself to standing.  Repeat __________ times. Complete this exercise __________ times a   day. Exercise K: Straight Leg Raises - Hip Abductors 1. Lie on your side with your left / right leg in the top position. Lie so your head, shoulder, knee, and hip line up. You may bend your bottom knee to help you keep your balance. 2. Roll your hips slightly forward so your hips are stacked directly over each other and your left / right knee is facing forward. 3. Leading with your heel, lift your top leg 4-6 inches (10-15 cm). You should feel the muscles in your outer hip lifting. ? Do not let your foot drift forward. ? Do not let your knee roll toward the ceiling. 4. Hold this position for __________ seconds. 5. Slowly return your leg to the starting position. 6. Let your muscles relax completely after each repetition. Repeat __________ times. Complete this exercise __________ times a day. Exercise L: Straight Leg Raises - Hip Extensors 1. Lie on your abdomen on a firm surface. You can put a pillow under your hips if that is more comfortable. 2. Tense the muscles in your buttocks and lift your left / right leg about 4-6 inches (10-15 cm). Keep your  knee straight as you lift your leg. 3. Hold this position for __________ seconds. 4. Slowly lower your leg to the starting position. 5. Let your leg relax completely after each repetition. Repeat __________ times. Complete this exercise __________ times a day. This information is not intended to replace advice given to you by your health care provider. Make sure you discuss any questions you have with your health care provider. Document Released: 12/29/2004 Document Revised: 11/09/2015 Document Reviewed: 12/21/2014 Elsevier Interactive Patient Education  2019 [employer] Cervical Strain and Sprain Rehab Ask your health care provider which exercises are safe for you. Do exercises exactly as told by your health care provider and adjust them as directed. It is normal to feel mild stretching, pulling, tightness, or discomfort as you do these exercises, but you should stop right away if you feel sudden pain or your pain gets worse.Do not begin these exercises until told by your health care provider. Stretching and range of motion exercises These exercises warm up your muscles and joints and improve the movement and flexibility of your neck. These exercises also help to relieve pain, numbness, and tingling. Exercise A: Cervical side bend  1. Using good posture, sit on a stable chair or stand up. 2. Without moving your shoulders, slowly tilt your left / right ear to your shoulder until you feel a stretch in your neck muscles. You should be looking straight ahead. 3. Hold for __________ seconds. 4. Repeat with the other side of your neck. Repeat __________ times. Complete this exercise __________ times a day. Exercise B: Cervical rotation  1. Using good posture, sit on a stable chair or stand up. 2. Slowly turn your head to the side as if you are looking over your left / right shoulder. ? Keep your eyes level with the ground. ? Stop when you feel a stretch along the side and the back of your  neck. 3. Hold for __________ seconds. 4. Repeat this by turning to your other side. Repeat __________ times. Complete this exercise __________ times a day. Exercise C: Thoracic extension and pectoral stretch 1. Roll a towel or a small blanket so it is about 4 inches (10 cm) in diameter. 2. Lie down on your back on a firm surface. 3. Put the towel lengthwise, under your spine in the middle of your back. It  should not be not under your shoulder blades. The towel should line up with your spine from your middle back to your lower back. 4. Put your hands behind your head and let your elbows fall out to your sides. 5. Hold for __________ seconds. Repeat __________ times. Complete this exercise __________ times a day. Strengthening exercises These exercises build strength and endurance in your neck. Endurance is the ability to use your muscles for a long time, even after your muscles get tired. Exercise D: Upper cervical flexion, isometric 1. Lie on your back with a thin pillow behind your head and a small rolled-up towel under your neck. 2. Gently tuck your chin toward your chest and nod your head down to look toward your feet. Do not lift your head off the pillow. 3. Hold for __________ seconds. 4. Release the tension slowly. Relax your neck muscles completely before you repeat this exercise. Repeat __________ times. Complete this exercise __________ times a day. Exercise E: Cervical extension, isometric  1. Stand about 6 inches (15 cm) away from a wall, with your back facing the wall. 2. Place a soft object, about 6-8 inches (15-20 cm) in diameter, between the back of your head and the wall. A soft object could be a small pillow, a ball, or a folded towel. 3. Gently tilt your head back and press into the soft object. Keep your jaw and forehead relaxed. 4. Hold for __________ seconds. 5. Release the tension slowly. Relax your neck muscles completely before you repeat this exercise. Repeat  __________ times. Complete this exercise __________ times a day. Posture and body mechanics Body mechanics refers to the movements and positions of your body while you do your daily activities. Posture is part of body mechanics. Good posture and healthy body mechanics can help to relieve stress in your body's tissues and joints. Good posture means that your spine is in its natural S-curve position (your spine is neutral), your shoulders are pulled back slightly, and your head is not tipped forward. The following are general guidelines for applying improved posture and body mechanics to your everyday activities. Standing   When standing, keep your spine neutral and keep your feet about hip-width apart. Keep a slight bend in your knees. Your ears, shoulders, and hips should line up.  When you do a task in which you stand in one place for a long time, place one foot up on a stable object that is 2-4 inches (5-10 cm) high, such as a footstool. This helps keep your spine neutral. Sitting   When sitting, keep your spine neutral and your keep feet flat on the floor. Use a footrest, if necessary, and keep your thighs parallel to the floor. Avoid rounding your shoulders, and avoid tilting your head forward.  When working at a desk or a computer, keep your desk at a height where your hands are slightly lower than your elbows. Slide your chair under your desk so you are close enough to maintain good posture.  When working at a computer, place your monitor at a height where you are looking straight ahead and you do not have to tilt your head forward or downward to look at the screen. Resting When lying down and resting, avoid positions that are most painful for you. Try to support your neck in a neutral position. You can use a contour pillow or a small rolled-up towel. Your pillow should support your neck but not push on it. This information is not intended to  replace advice given to you by your health care  provider. Make sure you discuss any questions you have with your health care provider. Document Released: 02/14/2005 Document Revised: 10/22/2015 Document Reviewed: 01/21/2015 Elsevier Interactive Patient Education  2019 [employer].

## 2018-03-27 ENCOUNTER — Ambulatory Visit: Payer: BLUE CROSS/BLUE SHIELD | Admitting: Rheumatology

## 2018-04-12 ENCOUNTER — Telehealth (INDEPENDENT_AMBULATORY_CARE_PROVIDER_SITE_OTHER): Payer: Self-pay | Admitting: Rheumatology

## 2018-04-12 NOTE — Telephone Encounter (Signed)
03/12/18 ov note faxed to Attn Maeola Sarah [phone]. Ph [phone]. Sheran Lawless

## 2018-06-21 ENCOUNTER — Ambulatory Visit: Payer: BLUE CROSS/BLUE SHIELD | Admitting: Rheumatology

## 2018-08-28 NOTE — Progress Notes (Deleted)
Office Visit Note  Patient: Sandy Waters             Date of Birth: 11/01/1965           MRN: [id number]             PCP: Patient, No Pcp Per Referring: No ref. provider found Visit Date: 09/11/2018 Occupation: @GUAROCC @  Subjective:  No chief complaint on file.   History of Present Illness: Sandy Waters is a 53 y.o. female ***   Activities of Daily Living:  Patient reports morning stiffness for *** {minute/hour:19697}.   Patient {ACTIONS;DENIES/REPORTS:21021675::"Denies"} nocturnal pain.  Difficulty dressing/grooming: {ACTIONS;DENIES/REPORTS:21021675::"Denies"} Difficulty climbing stairs: {ACTIONS;DENIES/REPORTS:21021675::"Denies"} Difficulty getting out of chair: {ACTIONS;DENIES/REPORTS:21021675::"Denies"} Difficulty using hands for taps, buttons, cutlery, and/or writing: {ACTIONS;DENIES/REPORTS:21021675::"Denies"}  No Rheumatology ROS completed.   PMFS History:  Patient Active Problem List   Diagnosis Date Noted  . Acid reflux 10/25/2017  . Change in bowel function 10/25/2017  . Fatty liver 10/25/2017  . Fibromyalgia 09/21/2017  . Other insomnia 09/21/2017  . Other fatigue 09/21/2017  . Carpal tunnel syndrome, left upper limb 09/21/2017  . 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 Primary osteoarthritis of both knees 09/21/2017  . DDD (degenerative disc disease), lumbar 09/21/2017  . Anxiety and depression 09/21/2017  . History of IBS 09/21/2017  . History of migraine 09/21/2017  . High cholesterol 09/21/2017  . History of stomach ulcers 09/21/2017    Past Medical History:  Diagnosis Date  . Acid reflux   . Anxiety   . Chronic low back pain   . Depression   . Fibromyalgia   . High cholesterol   . IBS (irritable bowel syndrome)     Family History  Problem Relation Age of Onset  . Heart disease Mother   . Hypertension Mother   . Diabetes Mother   . Breast cancer Mother   . Cancer Father        prostate   . Hypertension Sister   . Diabetes  Sister   . Hypertension Brother   . Schizophrenia Brother   . Diabetes Brother   . Scoliosis Daughter   . Colon cancer Neg Hx   . Colon polyps Neg Hx    Past Surgical History:  Procedure Laterality Date  . CESAREAN SECTION    . CHOLECYSTECTOMY    . COLONOSCOPY  2014   ?2014 AND REPORTEDLY NORMAL  . COLONOSCOPY  05/2006   Dr. Lissa HoardLouis D'Oro: few scattered ulcerations in lower rectum likely related to prep, toruous sigmoid colon difficult to pass, few diverticula in sigmoid colon.   . ESOPHAGOGASTRODUODENOSCOPY  11/08/2013   Dr. Amado CoeFein: normal. biopsies from proximal and distal esophagus with no increased intraepithelial eosinophils, no intestinal metaplasia. gastric bx with chronic active gastritis, no H.pylori  . ESOPHAGOGASTRODUODENOSCOPY  09/2010   Dr. Joycelyn SchmidSarah Lentz: bile reflux, small hiatal hernia  . ESOPHAGOGASTRODUODENOSCOPY  02/2005   Dr. Lissa HoardLouis D'Oro: gastritis mostly in the antrum, superficial ulcerations in the stomach, mild duodenitis, hiatal hernia. gastric bx negative.  Marland Kitchen. SHOULDER SURGERY Right 08/30/2016   bone spur/scar tissue removed   . TONSILLECTOMY     Social History   Social History Narrative  . Not on file    There is no immunization history on file for this patient.   Objective: Vital Signs: There were no vitals taken for this visit.   Physical Exam   Musculoskeletal Exam: ***  CDAI Exam: CDAI Score: - Patient Global: -; Provider Global: - Swollen: -; Tender: -  Joint Exam   No joint exam has been documented for this visit   There is currently no information documented on the homunculus. Go to the Rheumatology activity and complete the homunculus joint exam.  Investigation: No additional findings.  Imaging: No results found.  Recent Labs: Lab Results  Component Value Date   WBC 5.4 03/11/2012   HGB 13.9 03/11/2012   PLT 304 03/11/2012   NA 142 03/11/2012   K 3.7 03/11/2012   CL 107 03/11/2012   CO2 23 03/11/2012   GLUCOSE 103 (H)  03/11/2012   BUN 9 03/11/2012   CREATININE 0.78 03/11/2012   CALCIUM 9.8 03/11/2012   GFRAA >90 03/11/2012    Speciality Comments: No specialty comments available.  Procedures:  No procedures performed Allergies: Macrodantin [nitrofurantoin macrocrystal], Sulfa antibiotics, and Latex   Assessment / Plan:     Visit Diagnoses: No diagnosis found.   Orders: No orders of the defined types were placed in this encounter.  No orders of the defined types were placed in this encounter.   Face-to-face time spent with patient was *** minutes. Greater than 50% of time was spent in counseling and coordination of care.  Follow-Up Instructions: No follow-ups on file.   Earnestine Mealing, CMA  Note - This record has been created using Editor, commissioning.  Chart creation errors have been sought, but may not always  have been located. Such creation errors do not reflect on  the standard of medical care.

## 2018-09-04 NOTE — Progress Notes (Signed)
Office Visit Note  Patient: Sandy Waters             Date of Birth: 03/28/1965           MRN: [id number]             PCP: Patient, No Pcp Per Referring: No ref. provider found Visit Date: 09/05/2018 Occupation: @GUAROCC @  Subjective:  Generalized pain   History of Present Illness: Sandy ApoValarie F Waters is a 53 y.o. female with history of fibromyalgia, osteoarthritis, and DDD.  She takes tramadol 50 mg 3 times daily PRN for pain and Zanaflex 4 mg by mouth at bedtime for muscle spasms.  She uses Voltaren gel topically as needed for pain relief.  She presents today with left trochanter bursitis.  She states that the pain is worse after sitting for long peers of time.  She is also having some radiating pain down the posterior aspect of the left lower extremity.  She denies any recent injuries or falls.  She does have chronic lower back pain and had a MRI of the lumbar spine in 2016.  She followed up with a back specialist at the beginning of the year and reports she is postop surgery but has not wanted to proceed with an operation at this time.  She reports her fibromyalgia has been flaring more frequently since she has been under increased stress.  She is having increasing insensitivity and neuralgias.  She reports her fatigue has been worsening since she has not been sleeping well at night.  She has intermittent pain in bilateral hands and bilateral knee joints.  She denies any joint swelling.  She has been experiencing symptoms of carpal tunnel in the left hand.  She has not been wearing her night splint recently.  Activities of Daily Living:  Patient reports morning stiffness for 2 hours.   Patient Reports nocturnal pain.  Difficulty dressing/grooming: Denies Difficulty climbing stairs: Reports Difficulty getting out of chair: Reports Difficulty using hands for taps, buttons, cutlery, and/or writing: Reports  Review of Systems  Constitutional: Positive for fatigue.  HENT: Negative for  mouth sores, mouth dryness and nose dryness.   Eyes: Negative for pain, itching, visual disturbance and dryness.  Respiratory: Negative for cough, hemoptysis, shortness of breath, wheezing and difficulty breathing.   Cardiovascular: Negative for chest pain, palpitations, hypertension and swelling in legs/feet.  Gastrointestinal: Negative for abdominal pain, blood in stool, constipation and diarrhea.  Endocrine: Negative for increased urination.  Genitourinary: Negative for painful urination and pelvic pain.  Musculoskeletal: Positive for joint swelling, morning stiffness and muscle tenderness. Negative for arthralgias, joint pain, myalgias, muscle weakness and myalgias.  Skin: Negative for color change, pallor, rash, hair loss, nodules/bumps, redness, skin tightness, ulcers and sensitivity to sunlight.  Allergic/Immunologic: Negative for susceptible to infections.  Neurological: Negative for dizziness, numbness, headaches and memory loss.  Hematological: Negative for bruising/bleeding tendency and swollen glands.  Psychiatric/Behavioral: Negative for depressed mood, confusion and sleep disturbance. The patient is not nervous/anxious.     PMFS History:  Patient Active Problem List   Diagnosis Date Noted  . Acid reflux 10/25/2017  . Change in bowel function 10/25/2017  . Fatty liver 10/25/2017  . Fibromyalgia 09/21/2017  . Other insomnia 09/21/2017  . Other fatigue 09/21/2017  . Carpal tunnel syndrome, left upper limb 09/21/2017  . 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 Primary osteoarthritis of both knees 09/21/2017  . DDD (degenerative disc disease), lumbar 09/21/2017  . Anxiety and depression 09/21/2017  .  History of IBS 09/21/2017  . History of migraine 09/21/2017  . High cholesterol 09/21/2017  . History of stomach ulcers 09/21/2017    Past Medical History:  Diagnosis Date  . Acid reflux   . Anxiety   . Chronic low back pain   . Depression   . Fibromyalgia   . High  cholesterol   . IBS (irritable bowel syndrome)     Family History  Problem Relation Age of Onset  . Heart disease Mother   . Hypertension Mother   . Diabetes Mother   . Breast cancer Mother   . Cancer Father        prostate   . Hypertension Sister   . Diabetes Sister   . Hypertension Brother   . Schizophrenia Brother   . Diabetes Brother   . Scoliosis Daughter   . Colon cancer Neg Hx   . Colon polyps Neg Hx    Past Surgical History:  Procedure Laterality Date  . CESAREAN SECTION    . CHOLECYSTECTOMY    . COLONOSCOPY  2014   ?2014 AND REPORTEDLY NORMAL  . COLONOSCOPY  05/2006   Dr. Luiz Ochoa D'Oro: few scattered ulcerations in lower rectum likely related to prep, toruous sigmoid colon difficult to pass, few diverticula in sigmoid colon.   . ESOPHAGOGASTRODUODENOSCOPY  11/08/2013   Dr. Claudie Leach: normal. biopsies from proximal and distal esophagus with no increased intraepithelial eosinophils, no intestinal metaplasia. gastric bx with chronic active gastritis, no H.pylori  . ESOPHAGOGASTRODUODENOSCOPY  09/2010   Dr. Ena Dawley: bile reflux, small hiatal hernia  . ESOPHAGOGASTRODUODENOSCOPY  02/2005   Dr. Luiz Ochoa D'Oro: gastritis mostly in the antrum, superficial ulcerations in the stomach, mild duodenitis, hiatal hernia. gastric bx negative.  Marland Kitchen SHOULDER SURGERY Right 08/30/2016   bone spur/scar tissue removed   . TONSILLECTOMY     Social History   Social History Narrative  . Not on file    There is no immunization history on file for this patient.   Objective: Vital Signs: BP (!) 133/91 (BP Location: Left Arm, Patient Position: Sitting, Cuff Size: Normal)   Pulse 61   Resp 14   Ht 5\' 2"  (1.575 m)   Wt 208 lb 9.6 oz (94.6 kg)   BMI 38.15 kg/m    Physical Exam Vitals signs and nursing note reviewed.  Constitutional:      Appearance: She is well-developed.  HENT:     Head: Normocephalic and atraumatic.  Eyes:     Conjunctiva/sclera: Conjunctivae normal.  Neck:      Musculoskeletal: Normal range of motion.  Cardiovascular:     Rate and Rhythm: Normal rate and regular rhythm.     Heart sounds: Normal heart sounds.  Pulmonary:     Effort: Pulmonary effort is normal.     Breath sounds: Normal breath sounds.  Abdominal:     General: Bowel sounds are normal.     Palpations: Abdomen is soft.  Lymphadenopathy:     Cervical: No cervical adenopathy.  Skin:    General: Skin is warm and dry.     Capillary Refill: Capillary refill takes less than 2 seconds.  Neurological:     Mental Status: She is alert and oriented to person, place, and time.  Psychiatric:        Behavior: Behavior normal.      Musculoskeletal Exam: Generalized hyperalgesia and positive tender points.  C-spine, thoracic spine, lumbar spine good range of motion.  No midline spinal tenderness.  She has bilateral SI joint  tenderness.  Shoulder joints, elbow joints, wrist joints, MCPs, PIPs, DIPs good range of motion no synovitis.  She has complete fist formation bilaterally.  Hip joints have good range of motion.  She has tenderness over bilateral trochanteric bursa.  Knee joints, ankle joints, MCPs, PIPs, DIPs good range of motion no synovitis.  No warmth or effusion bilateral knee joints.  No tenderness or swelling of ankle joints.  CDAI Exam: CDAI Score: - Patient Global: -; Provider Global: - Swollen: -; Tender: - Joint Exam   No joint exam has been documented for this visit   There is currently no information documented on the homunculus. Go to the Rheumatology activity and complete the homunculus joint exam.  Investigation: No additional findings.  Imaging: No results found.  Recent Labs: Lab Results  Component Value Date   WBC 5.4 03/11/2012   HGB 13.9 03/11/2012   PLT 304 03/11/2012   NA 142 03/11/2012   K 3.7 03/11/2012   CL 107 03/11/2012   CO2 23 03/11/2012   GLUCOSE 103 (H) 03/11/2012   BUN 9 03/11/2012   CREATININE 0.78 03/11/2012   CALCIUM 9.8 03/11/2012    GFRAA >90 03/11/2012    Speciality Comments: No specialty comments available.  Procedures:  Large Joint Inj: L greater trochanter on 09/05/2018 2:25 PM Indications: pain Details: 27 G 1.5 in needle, lateral approach  Arthrogram: No  Medications: 1.5 mL lidocaine 1 %; 40 mg triamcinolone acetonide 40 MG/ML Aspirate: 0 mL Outcome: tolerated well, no immediate complications Procedure, treatment alternatives, risks and benefits explained, specific risks discussed. Consent was given by the patient. Immediately prior to procedure a time out was called to verify the correct patient, procedure, equipment, support staff and site/side marked as required. Patient was prepped and draped in the usual sterile fashion.     Allergies: Macrodantin [nitrofurantoin macrocrystal], Sulfa antibiotics, and Latex   Assessment / Plan:     Visit Diagnoses: Fibromyalgia - Generalized hyperalgesia and positive tender points on exam.  She continues with generalized muscle aches muscle tenderness due to fibromyalgia.  She has been having worsening myalgia neuralgias recently due to being under increased rest.  She has bilateral trochanter bursitis.  She is been experiencing severe pain on the left trochanteric bursa.  A left trochanter bursa cortisone junction was performed today.  She is given a handout of stretching exercises to perform.  She has been experiencing nocturnal pain which has caused interrupted sleep at night.  She has fatigue related to insomnia.  She continues to take tramadol and Zanaflex as prescribed for pain relief and muscle spasms.  She was encouraged to stay active and exercise on a regular basis.  She will follow-up in the office in 6 months.  Other insomnia - She has been experiencing nocturnal pain.  She takes Zanaflex 4 mg by mouth at bedtime.  Other fatigue - Chronic and related to insomnia.  Primary osteoarthritis of both knees - She has intermittent discomfort in bilateral knee joints.  No  warmth or effusion was noted.  She has good range of motion at this time.  Carpal tunnel syndrome, left upper limb - She had a NCV with EMG performed on 04/06/17.  The study revealed a borderline to very mild median nerve entrapment at the left wrist.  She has intermittent pain and paresthesias.  She is encouraged to wear her night splint on a regular basis.  DDD (degenerative disc disease), lumbar -Chronic pain.  She has no midline spinal tenderness on exam today.  She does have good range of motion.  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  She has seen a spine specialist in the past.  She does not want to proceed with surgery at this time.  She is advised to notify us if she develops any new or worsening symptoms.  Trochanteric bursitis, left hip - She has tenderness over the left trochanteric bursa on exam.  She has good left hip range of motion. She declined left hip x-rays today.  She has tenderness along the left IT band.  She was encouraged to perform stretching exercises on a regular basis.  She requested a left trochanteric bursa cortisone injection today.  She tolerated procedure well.  The procedure note was completed above.  Aftercare was discussed.  She is advised to notify us if her pain persists or worsens.  Other medical conditions are listed as follows:  Anxiety and depression  History of migraine   History of IBS   Orders: Orders Placed This Encounter  Procedures  . Large Joint Inj: L greater trochanter   No orders of the defined types were placed in this encounter.   Face-to-face time spent with patient was 30 minutes. Greater than 50% of time was spent in counseling and coordination of care.  Follow-Up Instructions: Return in about 6 months (around 03/08/2019) for Fibromyalgia, Osteoarthritis, DDD.   Sherron Alesaylor  Dale, PA-C  I examined and evaluated the patient with Sherron Alesaylor Dale PA.  Patient continues to have generalized pain and discomfort.  She had severe tenderness on palpation of her left trochanteric bursa on examination.  Per her request left trochanteric bursa was injected with cortisone which she tolerated well.  The plan of care was discussed as noted above.  Pollyann SavoyShaili Deveshwar, MD  Note - This record has been created using [employer] software.  Chart creation errors have been sought, but may not always  have been located. Such creation errors do not reflect on  the standard of medical care.

## 2018-09-05 ENCOUNTER — Other Ambulatory Visit: Payer: Self-pay

## 2018-09-05 ENCOUNTER — Encounter: Payer: Self-pay | Admitting: Rheumatology

## 2018-09-05 ENCOUNTER — Ambulatory Visit (INDEPENDENT_AMBULATORY_CARE_PROVIDER_SITE_OTHER): Payer: PRIVATE HEALTH INSURANCE | Admitting: Rheumatology

## 2018-09-05 VITALS — BP 133/91 | HR 61 | Resp 14 | Ht 62.0 in | Wt 208.6 lb

## 2018-09-05 DIAGNOSIS — M170 Bilateral primary osteoarthritis of knee: Secondary | ICD-10-CM | POA: Diagnosis not present

## 2018-09-05 DIAGNOSIS — G4709 Other insomnia: Secondary | ICD-10-CM | POA: Diagnosis not present

## 2018-09-05 DIAGNOSIS — F32A Depression, unspecified: Secondary | ICD-10-CM

## 2018-09-05 DIAGNOSIS — Z8719 Personal history of other diseases of the digestive system: Secondary | ICD-10-CM

## 2018-09-05 DIAGNOSIS — M797 Fibromyalgia: Secondary | ICD-10-CM | POA: Diagnosis not present

## 2018-09-05 DIAGNOSIS — M5136 Other intervertebral disc degeneration, lumbar region: Secondary | ICD-10-CM

## 2018-09-05 DIAGNOSIS — M7062 Trochanteric bursitis, left hip: Secondary | ICD-10-CM | POA: Diagnosis not present

## 2018-09-05 DIAGNOSIS — G5602 Carpal tunnel syndrome, left upper limb: Secondary | ICD-10-CM

## 2018-09-05 DIAGNOSIS — Z8669 Personal history of other diseases of the nervous system and sense organs: Secondary | ICD-10-CM

## 2018-09-05 DIAGNOSIS — F419 Anxiety disorder, unspecified: Secondary | ICD-10-CM

## 2018-09-05 DIAGNOSIS — F329 Major depressive disorder, single episode, unspecified: Secondary | ICD-10-CM

## 2018-09-05 DIAGNOSIS — R5383 Other fatigue: Secondary | ICD-10-CM | POA: Diagnosis not present

## 2018-09-05 DIAGNOSIS — M51369 Other intervertebral disc degeneration, lumbar region without mention of lumbar back pain or lower extremity pain: Secondary | ICD-10-CM

## 2018-09-05 MED ORDER — TRIAMCINOLONE ACETONIDE 40 MG/ML IJ SUSP
40.0000 mg | INTRAMUSCULAR | Status: AC | PRN
  Administered 2018-09-05: 40 mg via INTRA_ARTICULAR

## 2018-09-05 MED ORDER — LIDOCAINE HCL 1 % IJ SOLN
1.5000 mL | INTRAMUSCULAR | Status: AC | PRN
  Administered 2018-09-05: 1.5 mL

## 2018-09-05 NOTE — Patient Instructions (Signed)
Iliotibial Band Syndrome Rehab Ask your health care provider which exercises are safe for you. Do exercises exactly as told by your health care provider and adjust them as directed. It is normal to feel mild stretching, pulling, tightness, or discomfort as you do these exercises. Stop right away if you feel sudden pain or your pain gets significantly worse. Do not begin these exercises until told by your health care provider. Stretching and range-of-motion exercises These exercises warm up your muscles and joints and improve the movement and flexibility of your hip and pelvis. Quadriceps stretch, prone  1. Lie on your abdomen on a firm surface, such as a bed or padded floor (prone position). 2. Bend your left / right knee and reach back to hold your ankle or pant leg. If you cannot reach your ankle or pant leg, loop a belt around your foot and grab the belt instead. 3. Gently pull your heel toward your buttocks. Your knee should not slide out to the side. You should feel a stretch in the front of your thigh and knee (quadriceps). 4. Hold this position for __________ seconds. Repeat __________ times. Complete this exercise __________ times a day. Iliotibial band stretch An iliotibial band is a strong band of muscle tissue that runs from the outer side of your hip to the outer side of your thigh and knee. 1. Lie on your side with your left / right leg in the top position. 2. Bend both of your knees and grab your left / right ankle. Stretch out your bottom arm to help you balance. 3. Slowly bring your top knee back so your thigh goes behind your trunk. 4. Slowly lower your top leg toward the floor until you feel a gentle stretch on the outside of your left / right hip and thigh. If you do not feel a stretch and your knee will not fall farther, place the heel of your other foot on top of your knee and pull your knee down toward the floor with your foot. 5. Hold this position for __________ seconds.  Repeat __________ times. Complete this exercise __________ times a day. Strengthening exercises These exercises build strength and endurance in your hip and pelvis. Endurance is the ability to use your muscles for a long time, even after they get tired. Straight leg raises, side-lying This exercise strengthens the muscles that rotate the leg at the hip and move it away from your body (hip abductors). 1. Lie on your side with your left / right leg in the top position. Lie so your head, shoulder, hip, and knee line up. You may bend your bottom knee to help you balance. 2. Roll your hips slightly forward so your hips are stacked directly over each other and your left / right knee is facing forward. 3. Tense the muscles in your outer thigh and lift your top leg 4-6 inches (10-15 cm). 4. Hold this position for __________ seconds. 5. Slowly return to the starting position. Let your muscles relax completely before doing another repetition. Repeat __________ times. Complete this exercise __________ times a day. Leg raises, prone This exercise strengthens the muscles that move the hips (hip extensors). 1. Lie on your abdomen on your bed or a firm surface. You can put a pillow under your hips if that is more comfortable for your lower back. 2. Bend your left / right knee so your foot is straight up in the air. 3. Squeeze your buttocks muscles and lift your left / right thigh   off the bed. Do not let your back arch. 4. Tense your thigh muscle as hard as you can without increasing any knee pain. 5. Hold this position for __________ seconds. 6. Slowly lower your leg to the starting position and allow it to relax completely. Repeat __________ times. Complete this exercise __________ times a day. Hip hike 1. Stand sideways on a bottom step. Stand on your left / right leg with your other foot unsupported next to the step. You can hold on to the railing or wall for balance if needed. 2. Keep your knees straight  and your torso square. Then lift your left / right hip up toward the ceiling. 3. Slowly let your left / right hip lower toward the floor, past the starting position. Your foot should get closer to the floor. Do not lean or bend your knees. Repeat __________ times. Complete this exercise __________ times a day. This information is not intended to replace advice given to you by your health care provider. Make sure you discuss any questions you have with your health care provider. Document Released: 02/14/2005 Document Revised: 06/07/2018 Document Reviewed: 12/06/2017 Elsevier Patient Education  2020 [employer] Hip Bursitis Rehab Ask your health care provider which exercises are safe for you. Do exercises exactly as told by your health care provider and adjust them as directed. It is normal to feel mild stretching, pulling, tightness, or discomfort as you do these exercises. Stop right away if you feel sudden pain or your pain gets worse. Do not begin these exercises until told by your health care provider. Stretching exercise This exercise warms up your muscles and joints and improves the movement and flexibility of your hip. This exercise also helps to relieve pain and stiffness. Iliotibial band stretch An iliotibial band is a strong band of muscle tissue that runs from the outer side of your hip to the outer side of your thigh and knee. 1. Lie on your side with your left / right leg in the top position. 2. Bend your left / right knee and grab your ankle. Stretch out your bottom arm to help you balance. 3. Slowly bring your knee back so your thigh is behind your body. 4. Slowly lower your knee toward the floor until you feel a gentle stretch on the outside of your left / right thigh. If you do not feel a stretch and your knee will not fall farther, place the heel of your other foot on top of your knee and pull your knee down toward the floor with your foot. 5. Hold this position for __________  seconds. 6. Slowly return to the starting position. Repeat __________ times. Complete this exercise __________ times a day. Strengthening exercises These exercises build strength and endurance in your hip and pelvis. Endurance is the ability to use your muscles for a long time, even after they get tired. Bridge This exercise strengthens the muscles that move your thigh backward (hip extensors). 1. Lie on your back on a firm surface with your knees bent and your feet flat on the floor. 2. Tighten your buttocks muscles and lift your buttocks off the floor until your trunk is level with your thighs. ? Do not arch your back. ? You should feel the muscles working in your buttocks and the back of your thighs. If you do not feel these muscles, slide your feet 1-2 inches (2.5-5 cm) farther away from your buttocks. ? If this exercise is too easy, try doing it with your arms   crossed over your chest. 3. Hold this position for __________ seconds. 4. Slowly lower your hips to the starting position. 5. Let your muscles relax completely after each repetition. Repeat __________ times. Complete this exercise __________ times a day. Squats This exercise strengthens the muscles in front of your thigh and knee (quadriceps). 1. Stand in front of a table, with your feet and knees pointing straight ahead. You may rest your hands on the table for balance but not for support. 2. Slowly bend your knees and lower your hips like you are going to sit in a chair. ? Keep your weight over your heels, not over your toes. ? Keep your lower legs upright so they are parallel with the table legs. ? Do not let your hips go lower than your knees. ? Do not bend lower than told by your health care provider. ? If your hip pain increases, do not bend as low. 3. Hold the squat position for __________ seconds. 4. Slowly push with your legs to return to standing. Do not use your hands to pull yourself to standing. Repeat __________  times. Complete this exercise __________ times a day. Hip hike 1. Stand sideways on a bottom step. Stand on your left / right leg with your other foot unsupported next to the step. You can hold on to the railing or wall for balance if needed. 2. Keep your knees straight and your torso square. Then lift your left / right hip up toward the ceiling. 3. Hold this position for __________ seconds. 4. Slowly let your left / right hip lower toward the floor, past the starting position. Your foot should get closer to the floor. Do not lean or bend your knees. Repeat __________ times. Complete this exercise __________ times a day. Single leg stand 1. Without shoes, stand near a railing or in a doorway. You may hold on to the railing or door frame as needed for balance. 2. Squeeze your left / right buttock muscles, then lift up your other foot. ? Do not let your left / right hip push out to the side. ? It is helpful to stand in front of a mirror for this exercise so you can watch your hip. 3. Hold this position for __________ seconds. Repeat __________ times. Complete this exercise __________ times a day. This information is not intended to replace advice given to you by your health care provider. Make sure you discuss any questions you have with your health care provider. Document Released: 03/24/2004 Document Revised: 06/11/2018 Document Reviewed: 06/11/2018 Elsevier Patient Education  2020 [employer]  

## 2018-09-11 ENCOUNTER — Ambulatory Visit: Payer: Self-pay | Admitting: Rheumatology

## 2018-10-16 ENCOUNTER — Other Ambulatory Visit: Payer: Self-pay

## 2018-10-16 DIAGNOSIS — R7989 Other specified abnormal findings of blood chemistry: Secondary | ICD-10-CM

## 2018-10-16 DIAGNOSIS — R945 Abnormal results of liver function studies: Secondary | ICD-10-CM

## 2019-03-05 ENCOUNTER — Ambulatory Visit: Payer: PRIVATE HEALTH INSURANCE | Admitting: Rheumatology

## 2019-03-19 IMAGING — MR MR HEAD WO/W CM
12 series · 48 of 48 positions shown · IV contrast (20 ml multihance)
Comparison: None.

CLINICAL DATA: Dizziness.

EXAM:
MRI HEAD WITHOUT AND WITH CONTRAST
TECHNIQUE: Multiplanar, multiecho pulse sequences of the brain and surrounding
structures were obtained without and with intravenous contrast.
CONTRAST:  20mL MULTIHANCE GADOBENATE DIMEGLUMINE 529 MG/ML IV SOLN

[Series 5: T1 · sagittal · 4.0mm · 0.75mm/px · 2 of 30 slices shown (1 of 3)]
[im 1/30]
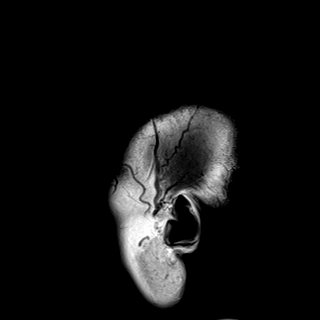
[im 30/30]
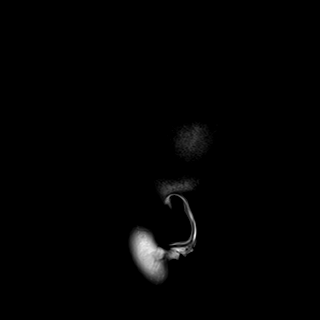

[Series 6: DWI · axial · 3.0mm · 1.44mm/px · z∈[-97,+61]mm · 5 of 98 slices shown (1 of 4)]
[im 1/98]
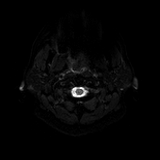
[im 25/98]
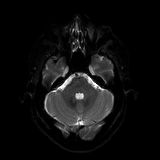
[im 49/98]
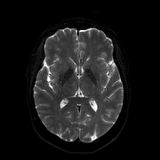
[im 73/98]
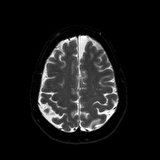
[im 98/98]
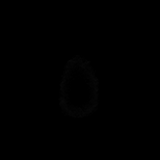

[Series 7: DWI · axial · 3.0mm · 1.44mm/px · z∈[-97,+61]mm · 3 of 49 slices shown (2 of 4)]
[im 1/49]
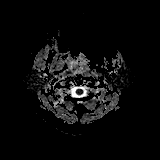
[im 25/49]
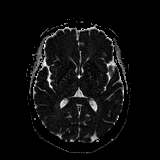
[im 49/49]
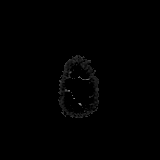

[Series 8: DWI · coronal · 5.0mm · 1.44mm/px · 4 of 68 slices shown (3 of 4)]
[im 1/68]
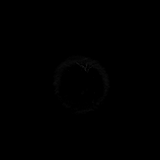
[im 23/68]
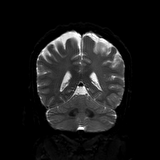
[im 45/68]
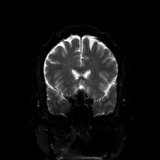
[im 68/68]
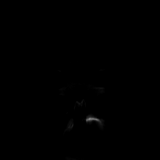

[Series 9: DWI · coronal · 5.0mm · 1.44mm/px · 2 of 34 slices shown (4 of 4)]
[im 1/34]
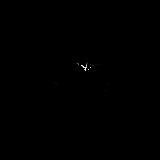
[im 34/34]
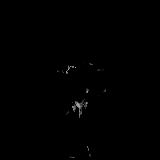

[Series 10: T2 · axial · 4.0mm · 0.36mm/px · z∈[-101,+60]mm · 2 of 32 slices shown]
[im 1/32]
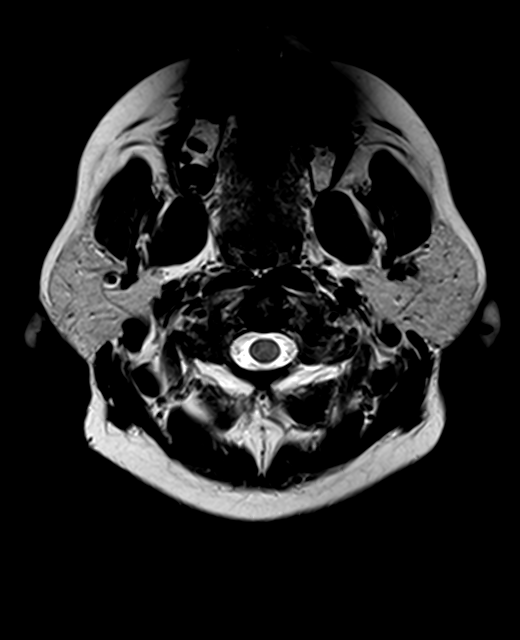
[im 32/32]
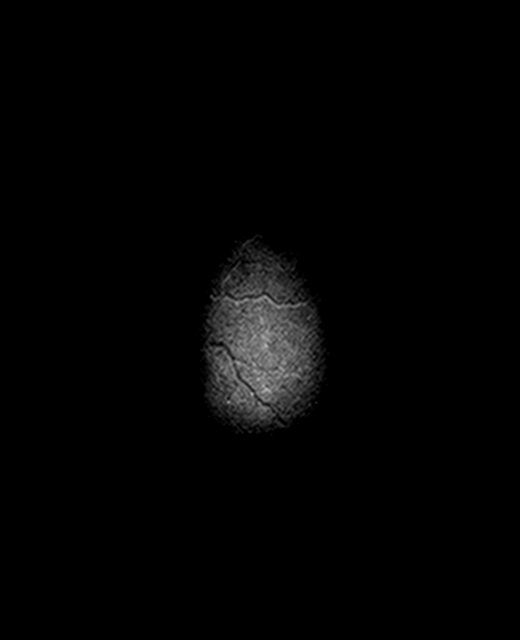

[Series 11: FLAIR · axial · 3.0mm · 0.72mm/px · 1 of 26 slices shown]
[im 1/26]
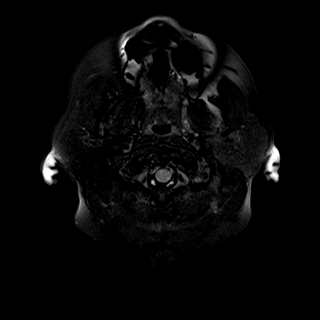

[Series 13: swi_images · axial · 1.5mm · 0.90mm/px · z∈[-92,+50]mm · 5 of 96 slices shown]
[im 1/96]
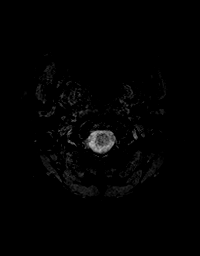
[im 24/96]
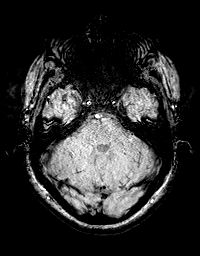
[im 48/96]
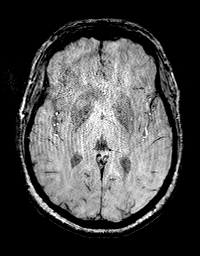
[im 72/96]
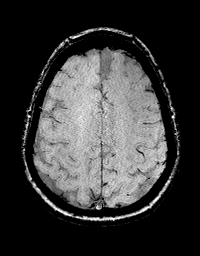
[im 96/96]
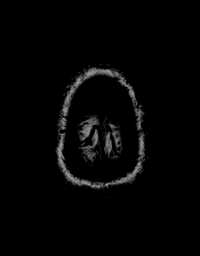

[Series 14: T1 · axial · 1.0mm · 0.90mm/px · z∈[-108,+67]mm · 10 of 176 slices shown (2 of 3)]
[im 1/176]
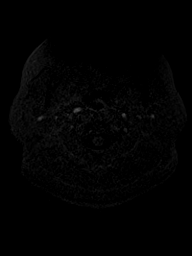
[im 20/176]
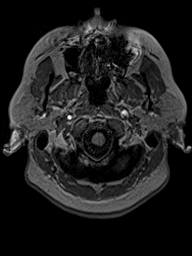
[im 39/176]
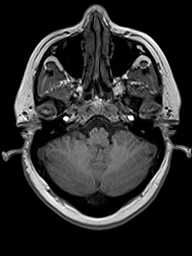
[im 59/176]
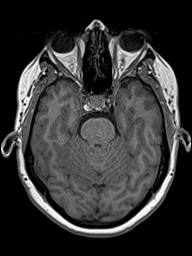
[im 78/176]
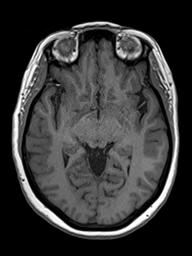
[im 98/176]
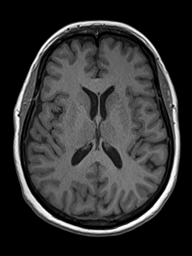
[im 117/176]
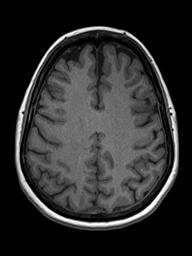
[im 137/176]
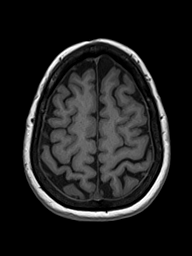
[im 156/176]
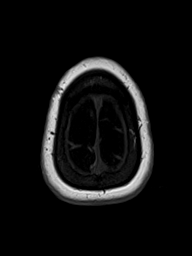
[im 176/176]
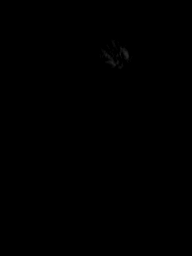

[Series 15: T2 post-contrast · coronal · 4.0mm · 0.36mm/px · 2 of 37 slices shown]
[im 1/37]
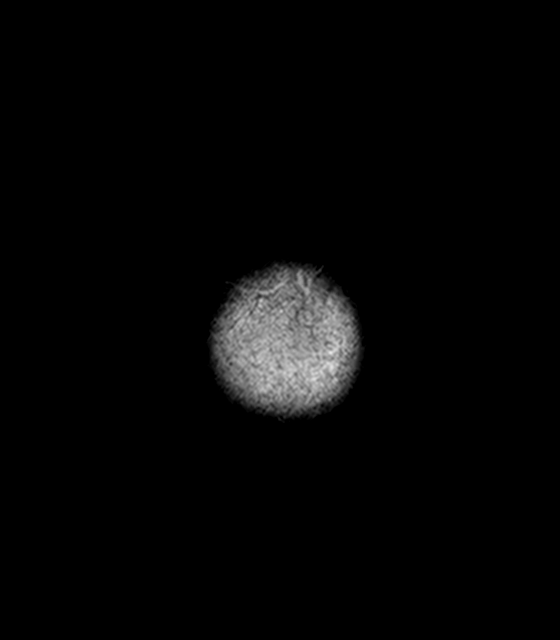
[im 37/37]
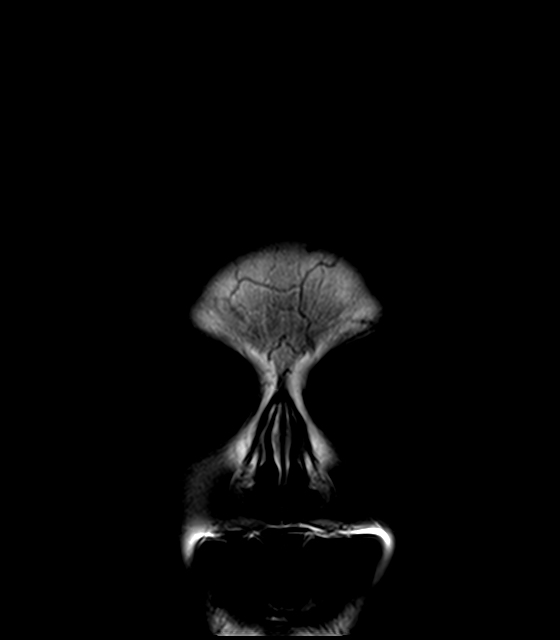

[Series 16: T1 · axial · 1.0mm · 0.90mm/px · z∈[-108,+67]mm · 10 of 176 slices shown (3 of 3)]
[im 1/176]
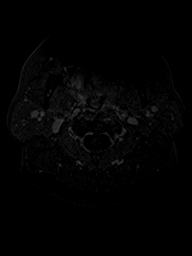
[im 20/176]
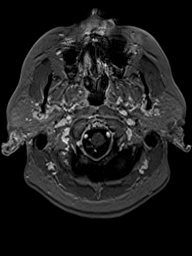
[im 39/176]
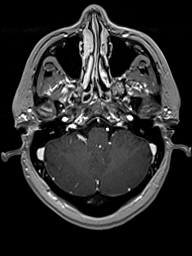
[im 59/176]
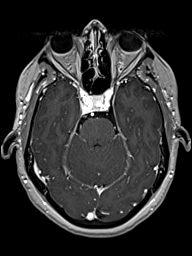
[im 78/176]
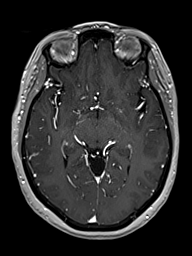
[im 98/176]
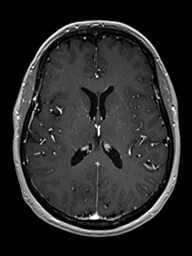
[im 117/176]
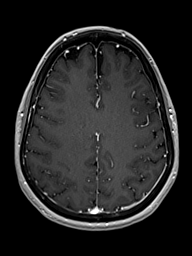
[im 137/176]
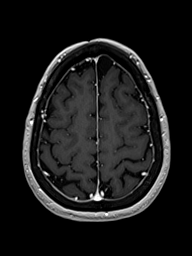
[im 156/176]
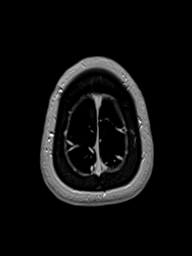
[im 176/176]
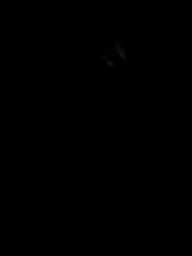

[Series 17: T1 post-contrast · coronal · 4.0mm · 0.72mm/px · 2 of 37 slices shown]
[im 1/37]
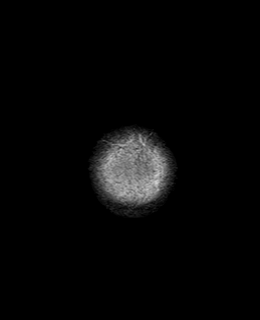
[im 37/37]
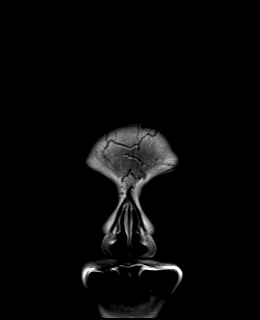

[48 of 48 positions shown; findings below may reference images not displayed]

FINDINGS: INTRACRANIAL CONTENTS: No reduced diffusion to suggest acute
ischemia or hyperacute demyelination. No susceptibility artifact to
suggest hemorrhage. The ventricles and sulci are normal for
patient's age. No suspicious parenchymal signal, masses, mass
effect. No abnormal intraparenchymal or extra-axial enhancement. No
abnormal extra-axial fluid collections. No extra-axial masses.

VASCULAR: Normal major intracranial vascular flow voids present at
skull base.

SKULL AND UPPER CERVICAL SPINE: No abnormal sellar expansion. No
suspicious calvarial bone marrow signal. Craniocervical junction
maintained.

SINUSES/ORBITS: The mastoid air-cells and included paranasal sinuses
are well-aerated.The included ocular globes and orbital contents are
non-suspicious.

OTHER: None.
IMPRESSION: Normal MRI head without and with contrast.

## 2019-10-02 LAB — RESULTS CONSOLE HPV: CHL HPV: NEGATIVE

## 2019-10-04 LAB — HM PAP SMEAR

## 2020-02-18 DIAGNOSIS — E039 Hypothyroidism, unspecified: Secondary | ICD-10-CM | POA: Diagnosis not present

## 2020-02-18 DIAGNOSIS — R079 Chest pain, unspecified: Secondary | ICD-10-CM | POA: Diagnosis not present

## 2020-02-18 DIAGNOSIS — D352 Benign neoplasm of pituitary gland: Secondary | ICD-10-CM | POA: Diagnosis not present

## 2020-02-18 DIAGNOSIS — M898X1 Other specified disorders of bone, shoulder: Secondary | ICD-10-CM | POA: Diagnosis not present

## 2020-02-18 DIAGNOSIS — I10 Essential (primary) hypertension: Secondary | ICD-10-CM | POA: Diagnosis not present

## 2020-02-18 DIAGNOSIS — G8929 Other chronic pain: Secondary | ICD-10-CM | POA: Diagnosis not present

## 2020-02-18 DIAGNOSIS — Z299 Encounter for prophylactic measures, unspecified: Secondary | ICD-10-CM | POA: Diagnosis not present

## 2020-04-28 DIAGNOSIS — D352 Benign neoplasm of pituitary gland: Secondary | ICD-10-CM | POA: Diagnosis not present

## 2020-04-28 DIAGNOSIS — F419 Anxiety disorder, unspecified: Secondary | ICD-10-CM | POA: Diagnosis not present

## 2020-04-28 DIAGNOSIS — I10 Essential (primary) hypertension: Secondary | ICD-10-CM | POA: Diagnosis not present

## 2020-04-28 DIAGNOSIS — E039 Hypothyroidism, unspecified: Secondary | ICD-10-CM | POA: Diagnosis not present

## 2020-05-26 DIAGNOSIS — Z299 Encounter for prophylactic measures, unspecified: Secondary | ICD-10-CM | POA: Diagnosis not present

## 2020-05-26 DIAGNOSIS — I10 Essential (primary) hypertension: Secondary | ICD-10-CM | POA: Diagnosis not present

## 2020-05-26 DIAGNOSIS — J069 Acute upper respiratory infection, unspecified: Secondary | ICD-10-CM | POA: Diagnosis not present

## 2020-06-03 NOTE — Progress Notes (Deleted)
Office Visit Note  Patient: Sandy Waters             Date of Birth: [birth year]           MRN: [id number]             PCP: Patient, No Pcp Per (Inactive) Referring: No ref. provider found Visit Date: 06/16/2020 Occupation: @GUAROCC @  Subjective:  No chief complaint on file.   History of Present Illness: Sandy Waters is a 55 y.o. female ***   Activities of Daily Living:  Patient reports morning stiffness for *** {minute/hour:19697}.   Patient {ACTIONS;DENIES/REPORTS:21021675::"Denies"} nocturnal pain.  Difficulty dressing/grooming: {ACTIONS;DENIES/REPORTS:21021675::"Denies"} Difficulty climbing stairs: {ACTIONS;DENIES/REPORTS:21021675::"Denies"} Difficulty getting out of chair: {ACTIONS;DENIES/REPORTS:21021675::"Denies"} Difficulty using hands for taps, buttons, cutlery, and/or writing: {ACTIONS;DENIES/REPORTS:21021675::"Denies"}  No Rheumatology ROS completed.   PMFS History:  Patient Active Problem List   Diagnosis Date Noted  . Acid reflux 10/25/2017  . Change in bowel function 10/25/2017  . Fatty liver 10/25/2017  . Fibromyalgia 09/21/2017  . Other insomnia 09/21/2017  . Other fatigue 09/21/2017  . Carpal tunnel syndrome, left upper limb 09/21/2017  . 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 Primary osteoarthritis of both knees 09/21/2017  . DDD (degenerative disc disease), lumbar 09/21/2017  . Anxiety and depression 09/21/2017  . History of IBS 09/21/2017  . History of migraine 09/21/2017  . High cholesterol 09/21/2017  . History of stomach ulcers 09/21/2017    Past Medical History:  Diagnosis Date  . Acid reflux   . Anxiety   . Chronic low back pain   . Depression   . Fibromyalgia   . High cholesterol   . IBS (irritable bowel syndrome)     Family History  Problem Relation Age of Onset  . Heart disease Mother   . Hypertension Mother   . Diabetes Mother   . Breast cancer Mother   . Cancer Father        prostate   . Hypertension Sister   .  Diabetes Sister   . Hypertension Brother   . Schizophrenia Brother   . Diabetes Brother   . Scoliosis Daughter   . Colon cancer Neg Hx   . Colon polyps Neg Hx    Past Surgical History:  Procedure Laterality Date  . CESAREAN SECTION    . CHOLECYSTECTOMY    . COLONOSCOPY  2014   ?2014 AND REPORTEDLY NORMAL  . COLONOSCOPY  05/2006   Dr. 06/2006 D'Oro: few scattered ulcerations in lower rectum likely related to prep, toruous sigmoid colon difficult to pass, few diverticula in sigmoid colon.   . ESOPHAGOGASTRODUODENOSCOPY  11/08/2013   Dr. 01/08/2014: normal. biopsies from proximal and distal esophagus with no increased intraepithelial eosinophils, no intestinal metaplasia. gastric bx with chronic active gastritis, no H.pylori  . ESOPHAGOGASTRODUODENOSCOPY  09/2010   Dr. 10/2010: bile reflux, small hiatal hernia  . ESOPHAGOGASTRODUODENOSCOPY  02/2005   Dr. 03/2005 D'Oro: gastritis mostly in the antrum, superficial ulcerations in the stomach, mild duodenitis, hiatal hernia. gastric bx negative.  Lissa Hoard SHOULDER SURGERY Right 08/30/2016   bone spur/scar tissue removed   . TONSILLECTOMY     Social History   Social History Narrative  . Not on file    There is no immunization history on file for this patient.   Objective: Vital Signs: There were no vitals taken for this visit.   Physical Exam   Musculoskeletal Exam: ***  CDAI Exam: CDAI Score: -- Patient Global: --; Provider Global: -- Swollen: --; Tender: --  Joint Exam 06/16/2020   No joint exam has been documented for this visit   There is currently no information documented on the homunculus. Go to the Rheumatology activity and complete the homunculus joint exam.  Investigation: No additional findings.  Imaging: No results found.  Recent Labs: Lab Results  Component Value Date   WBC 5.4 03/11/2012   HGB 13.9 03/11/2012   PLT 304 03/11/2012   NA 142 03/11/2012   K 3.7 03/11/2012   CL 107 03/11/2012   CO2 23 03/11/2012    GLUCOSE 103 (H) 03/11/2012   BUN 9 03/11/2012   CREATININE 0.78 03/11/2012   CALCIUM 9.8 03/11/2012   GFRAA >90 03/11/2012    Speciality Comments: No specialty comments available.  Procedures:  No procedures performed Allergies: Macrodantin [nitrofurantoin macrocrystal], Sulfa antibiotics, and Latex   Assessment / Plan:     Visit Diagnoses: No diagnosis found.  Orders: No orders of the defined types were placed in this encounter.  No orders of the defined types were placed in this encounter.   Face-to-face time spent with patient was *** minutes. Greater than 50% of time was spent in counseling and coordination of care.  Follow-Up Instructions: No follow-ups on file.   Ellen Henri, CMA  Note - This record has been created using Animal nutritionist.  Chart creation errors have been sought, but may not always  have been located. Such creation errors do not reflect on  the standard of medical care.

## 2020-06-16 ENCOUNTER — Ambulatory Visit: Payer: PRIVATE HEALTH INSURANCE | Admitting: Rheumatology

## 2020-06-16 DIAGNOSIS — R5383 Other fatigue: Secondary | ICD-10-CM

## 2020-06-16 DIAGNOSIS — Z8719 Personal history of other diseases of the digestive system: Secondary | ICD-10-CM

## 2020-06-16 DIAGNOSIS — M170 Bilateral primary osteoarthritis of knee: Secondary | ICD-10-CM

## 2020-06-16 DIAGNOSIS — M797 Fibromyalgia: Secondary | ICD-10-CM

## 2020-06-16 DIAGNOSIS — Z8669 Personal history of other diseases of the nervous system and sense organs: Secondary | ICD-10-CM

## 2020-06-16 DIAGNOSIS — M5136 Other intervertebral disc degeneration, lumbar region: Secondary | ICD-10-CM

## 2020-06-16 DIAGNOSIS — G5602 Carpal tunnel syndrome, left upper limb: Secondary | ICD-10-CM

## 2020-06-16 DIAGNOSIS — F419 Anxiety disorder, unspecified: Secondary | ICD-10-CM

## 2020-06-16 DIAGNOSIS — M7062 Trochanteric bursitis, left hip: Secondary | ICD-10-CM

## 2020-06-16 DIAGNOSIS — G4709 Other insomnia: Secondary | ICD-10-CM

## 2020-09-02 DIAGNOSIS — L738 Other specified follicular disorders: Secondary | ICD-10-CM | POA: Diagnosis not present

## 2020-09-02 DIAGNOSIS — L658 Other specified nonscarring hair loss: Secondary | ICD-10-CM | POA: Diagnosis not present

## 2020-09-10 DIAGNOSIS — Z1231 Encounter for screening mammogram for malignant neoplasm of breast: Secondary | ICD-10-CM | POA: Diagnosis not present

## 2020-10-06 DIAGNOSIS — M797 Fibromyalgia: Secondary | ICD-10-CM | POA: Diagnosis not present

## 2020-10-06 DIAGNOSIS — Z299 Encounter for prophylactic measures, unspecified: Secondary | ICD-10-CM | POA: Diagnosis not present

## 2020-10-06 DIAGNOSIS — I10 Essential (primary) hypertension: Secondary | ICD-10-CM | POA: Diagnosis not present

## 2020-10-06 DIAGNOSIS — Z79899 Other long term (current) drug therapy: Secondary | ICD-10-CM | POA: Diagnosis not present

## 2020-12-30 DIAGNOSIS — Z2821 Immunization not carried out because of patient refusal: Secondary | ICD-10-CM | POA: Diagnosis not present

## 2020-12-30 DIAGNOSIS — E7800 Pure hypercholesterolemia, unspecified: Secondary | ICD-10-CM | POA: Diagnosis not present

## 2020-12-30 DIAGNOSIS — Z7189 Other specified counseling: Secondary | ICD-10-CM | POA: Diagnosis not present

## 2020-12-30 DIAGNOSIS — Z6839 Body mass index (BMI) 39.0-39.9, adult: Secondary | ICD-10-CM | POA: Diagnosis not present

## 2020-12-30 DIAGNOSIS — Z1331 Encounter for screening for depression: Secondary | ICD-10-CM | POA: Diagnosis not present

## 2020-12-30 DIAGNOSIS — Z87891 Personal history of nicotine dependence: Secondary | ICD-10-CM | POA: Diagnosis not present

## 2020-12-30 DIAGNOSIS — R5383 Other fatigue: Secondary | ICD-10-CM | POA: Diagnosis not present

## 2020-12-30 DIAGNOSIS — E039 Hypothyroidism, unspecified: Secondary | ICD-10-CM | POA: Diagnosis not present

## 2020-12-30 DIAGNOSIS — E559 Vitamin D deficiency, unspecified: Secondary | ICD-10-CM | POA: Diagnosis not present

## 2020-12-30 DIAGNOSIS — Z0000 Encounter for general adult medical examination without abnormal findings: Secondary | ICD-10-CM | POA: Diagnosis not present

## 2020-12-30 DIAGNOSIS — Z1339 Encounter for screening examination for other mental health and behavioral disorders: Secondary | ICD-10-CM | POA: Diagnosis not present

## 2020-12-30 DIAGNOSIS — Z299 Encounter for prophylactic measures, unspecified: Secondary | ICD-10-CM | POA: Diagnosis not present

## 2021-07-01 DIAGNOSIS — D352 Benign neoplasm of pituitary gland: Secondary | ICD-10-CM | POA: Diagnosis not present

## 2021-07-01 DIAGNOSIS — Z299 Encounter for prophylactic measures, unspecified: Secondary | ICD-10-CM | POA: Diagnosis not present

## 2021-07-01 DIAGNOSIS — M797 Fibromyalgia: Secondary | ICD-10-CM | POA: Diagnosis not present

## 2021-07-01 DIAGNOSIS — I10 Essential (primary) hypertension: Secondary | ICD-10-CM | POA: Diagnosis not present

## 2021-07-01 DIAGNOSIS — Z79899 Other long term (current) drug therapy: Secondary | ICD-10-CM | POA: Diagnosis not present

## 2021-07-01 DIAGNOSIS — M4310 Spondylolisthesis, site unspecified: Secondary | ICD-10-CM | POA: Diagnosis not present

## 2021-11-29 DIAGNOSIS — Z1231 Encounter for screening mammogram for malignant neoplasm of breast: Secondary | ICD-10-CM | POA: Diagnosis not present

## 2021-11-29 LAB — HM MAMMOGRAPHY

## 2021-12-31 DIAGNOSIS — M62830 Muscle spasm of back: Secondary | ICD-10-CM | POA: Diagnosis not present

## 2021-12-31 DIAGNOSIS — Z1331 Encounter for screening for depression: Secondary | ICD-10-CM | POA: Diagnosis not present

## 2021-12-31 DIAGNOSIS — R5383 Other fatigue: Secondary | ICD-10-CM | POA: Diagnosis not present

## 2021-12-31 DIAGNOSIS — I10 Essential (primary) hypertension: Secondary | ICD-10-CM | POA: Diagnosis not present

## 2021-12-31 DIAGNOSIS — E7800 Pure hypercholesterolemia, unspecified: Secondary | ICD-10-CM | POA: Diagnosis not present

## 2021-12-31 DIAGNOSIS — Z299 Encounter for prophylactic measures, unspecified: Secondary | ICD-10-CM | POA: Diagnosis not present

## 2021-12-31 DIAGNOSIS — Z79899 Other long term (current) drug therapy: Secondary | ICD-10-CM | POA: Diagnosis not present

## 2021-12-31 DIAGNOSIS — E039 Hypothyroidism, unspecified: Secondary | ICD-10-CM | POA: Diagnosis not present

## 2021-12-31 DIAGNOSIS — Z6839 Body mass index (BMI) 39.0-39.9, adult: Secondary | ICD-10-CM | POA: Diagnosis not present

## 2021-12-31 DIAGNOSIS — Z1339 Encounter for screening examination for other mental health and behavioral disorders: Secondary | ICD-10-CM | POA: Diagnosis not present

## 2021-12-31 DIAGNOSIS — Z0000 Encounter for general adult medical examination without abnormal findings: Secondary | ICD-10-CM | POA: Diagnosis not present

## 2021-12-31 DIAGNOSIS — Z7189 Other specified counseling: Secondary | ICD-10-CM | POA: Diagnosis not present

## 2022-03-15 DIAGNOSIS — G8929 Other chronic pain: Secondary | ICD-10-CM | POA: Diagnosis not present

## 2022-03-15 DIAGNOSIS — M549 Dorsalgia, unspecified: Secondary | ICD-10-CM | POA: Diagnosis not present

## 2022-03-15 DIAGNOSIS — M797 Fibromyalgia: Secondary | ICD-10-CM | POA: Diagnosis not present

## 2022-03-15 DIAGNOSIS — I10 Essential (primary) hypertension: Secondary | ICD-10-CM | POA: Diagnosis not present

## 2022-03-15 DIAGNOSIS — Z299 Encounter for prophylactic measures, unspecified: Secondary | ICD-10-CM | POA: Diagnosis not present

## 2022-04-02 DIAGNOSIS — H43393 Other vitreous opacities, bilateral: Secondary | ICD-10-CM | POA: Diagnosis not present

## 2022-04-12 DIAGNOSIS — Z0100 Encounter for examination of eyes and vision without abnormal findings: Secondary | ICD-10-CM | POA: Diagnosis not present

## 2022-04-30 ENCOUNTER — Ambulatory Visit
Admission: EM | Admit: 2022-04-30 | Discharge: 2022-04-30 | Disposition: A | Discharge status: Home / Self Care | Payer: Medicare HMO | Attending: Urgent Care | Admitting: Urgent Care

## 2022-04-30 DIAGNOSIS — R109 Unspecified abdominal pain: Secondary | ICD-10-CM

## 2022-04-30 DIAGNOSIS — M546 Pain in thoracic spine: Secondary | ICD-10-CM

## 2022-04-30 LAB — POCT URINALYSIS DIP (MANUAL ENTRY)
Bilirubin, UA: NEGATIVE
Blood, UA: NEGATIVE
Glucose, UA: NEGATIVE mg/dL
Ketones, POC UA: NEGATIVE mg/dL
Leukocytes, UA: NEGATIVE
Nitrite, UA: NEGATIVE
Protein Ur, POC: NEGATIVE mg/dL
Spec Grav, UA: 1.025 (ref 1.010–1.025)
Urobilinogen, UA: 0.2 E.U./dL
pH, UA: 5.5 (ref 5.0–8.0)

## 2022-04-30 MED ORDER — CYCLOBENZAPRINE HCL 5 MG PO TABS: 5.0000 mg | ORAL_TABLET | Freq: Every evening | ORAL | 0 refills | Status: DC | PRN

## 2022-04-30 MED ORDER — IBUPROFEN 600 MG PO TABS: 600.0000 mg | ORAL_TABLET | Freq: Four times a day (QID) | ORAL | 0 refills | Status: DC | PRN

## 2022-04-30 NOTE — ED Provider Notes (Signed)
[facility]  Note:  This document was prepared using Systems analyst and may include unintentional dictation errors.  MRN: [id number] DOB: [birth year]  Subjective:   Sandy Waters is a 57 y.o. female presenting for 4-day history of persistent right-sided thoracic/flank pain with nausea but no vomiting. Had some constipation in the past but started having bowel movements in the past 2 days. Was constipated prior to her abdominal pain starting.  Takes tramadol regularly, has fibromyalgia.  No fever, hematemesis, bloody stools, diarrhea, hematuria, dysuria.  No history of kidney stones.  Has a history of IBS, cholecystectomy.  Also has a history of fatty liver.  No history of kidney disease.  No fall, trauma, heavy lifting.  No current facility-administered medications for this encounter.  Current Outpatient Medications:    buPROPion (WELLBUTRIN XL) 300 MG 24 hr tablet, Take by mouth daily. , Disp: , Rfl:    busPIRone (BUSPAR) 5 MG tablet, Take by mouth as needed. , Disp: , Rfl:    diclofenac sodium (VOLTAREN) 1 % GEL, Voltaren Gel 3 grams to 3 large joints upto TID 10 TUBES with 3 refills, Disp: 5 Tube, Rfl: 0   fluticasone (FLONASE) 50 MCG/ACT nasal spray, Place into both nostrils daily., Disp: , Rfl:    naproxen (NAPROSYN) 500 MG tablet, Take 500 mg by mouth as needed. , Disp: , Rfl:    pantoprazole (PROTONIX) 40 MG tablet, Take 40 mg by mouth daily., Disp: , Rfl: 2   tiZANidine (ZANAFLEX) 4 MG tablet, as needed. , Disp: , Rfl: 1   traMADol (ULTRAM) 50 MG tablet, Take 50 mg by mouth 3 (three) times daily as needed. For pain, Disp: , Rfl:    Allergies  Allergen Reactions   Macrodantin [Nitrofurantoin Macrocrystal] Swelling    Face swells   Sulfa Antibiotics Swelling    microdantin   Latex Itching and Rash    Band aids    Past Medical History:  Diagnosis Date   Acid reflux    Anxiety    Chronic low back pain    Depression     Fibromyalgia    High cholesterol    IBS (irritable bowel syndrome)      Past Surgical History:  Procedure Laterality Date   CESAREAN SECTION     CHOLECYSTECTOMY     COLONOSCOPY  2014   ?2014 AND REPORTEDLY NORMAL   COLONOSCOPY  05/2006   Dr. Luiz Ochoa D'Oro: few scattered ulcerations in lower rectum likely related to prep, toruous sigmoid colon difficult to pass, few diverticula in sigmoid colon.    ESOPHAGOGASTRODUODENOSCOPY  11/08/2013   Dr. Claudie Leach: normal. biopsies from proximal and distal esophagus with no increased intraepithelial eosinophils, no intestinal metaplasia. gastric bx with chronic active gastritis, no H.pylori   ESOPHAGOGASTRODUODENOSCOPY  09/2010   Dr. Ena Dawley: bile reflux, small hiatal hernia   ESOPHAGOGASTRODUODENOSCOPY  02/2005   Dr. Luiz Ochoa D'Oro: gastritis mostly in the antrum, superficial ulcerations in the stomach, mild duodenitis, hiatal hernia. gastric bx negative.   SHOULDER SURGERY Right 08/30/2016   bone spur/scar tissue removed    TONSILLECTOMY      Family History  Problem Relation Age of Onset   Heart disease Mother    Hypertension Mother    Diabetes Mother    Breast cancer Mother    Cancer Father        prostate    Hypertension Sister    Diabetes Sister    Hypertension Brother    Schizophrenia Brother  Diabetes Brother    Scoliosis Daughter    Colon cancer Neg Hx    Colon polyps Neg Hx     Social History   Tobacco Use   Smoking status: Former    Types: Cigarettes    Quit date: 02/28/1993    Years since quitting: 29.1   Smokeless tobacco: Never  Vaping Use   Vaping Use: Never used  Substance Use Topics   Alcohol use: Yes    Comment: occ   Drug use: Never    ROS   Objective:   Vitals: BP 123/84 (BP Location: Right Arm)   Pulse 77   Temp 98.2 F (36.8 C) (Oral)   Resp 20   SpO2 97%   Physical Exam Constitutional:      General: She is not in acute distress.    Appearance: Normal appearance. She is well-developed. She  is not ill-appearing, toxic-appearing or diaphoretic.  HENT:     Head: Normocephalic and atraumatic.     Nose: Nose normal.     Mouth/Throat:     Mouth: Mucous membranes are moist.     Pharynx: Oropharynx is clear.  Eyes:     General: No scleral icterus.       Right eye: No discharge.        Left eye: No discharge.     Extraocular Movements: Extraocular movements intact.     Conjunctiva/sclera: Conjunctivae normal.  Cardiovascular:     Rate and Rhythm: Normal rate.  Pulmonary:     Effort: Pulmonary effort is normal.  Abdominal:     General: Bowel sounds are normal. There is no distension.     Palpations: Abdomen is soft. There is no mass.     Tenderness: There is no abdominal tenderness. There is no right CVA tenderness, left CVA tenderness, guarding or rebound.  Musculoskeletal:     Thoracic back: Tenderness (has movement pain, superficial lateral thoracic and right-sided flank pain) present. No swelling, edema, deformity, signs of trauma, lacerations, spasms or bony tenderness. Normal range of motion. No scoliosis.  Skin:    General: Skin is warm and dry.  Neurological:     General: No focal deficit present.     Mental Status: She is alert and oriented to person, place, and time.  Psychiatric:        Mood and Affect: Mood normal.        Behavior: Behavior normal.        Thought Content: Thought content normal.        Judgment: Judgment normal.     Results for orders placed or performed during the hospital encounter of 04/30/22 (from the past 24 hour(s))  POCT urinalysis dipstick     Status: None   Collection Time: 04/30/22 12:53 PM  Result Value Ref Range   Color, UA yellow yellow   Clarity, UA clear clear   Glucose, UA negative negative mg/dL   Bilirubin, UA negative negative   Ketones, POC UA negative negative mg/dL   Spec Grav, UA 1.025 1.010 - 1.025   Blood, UA negative negative   pH, UA 5.5 5.0 - 8.0   Protein Ur, POC negative negative mg/dL   Urobilinogen, UA  0.2 0.2 or 1.0 E.U./dL   Nitrite, UA Negative Negative   Leukocytes, UA Negative Negative    Assessment and Plan :   PDMP not reviewed this encounter.  1. Acute right-sided thoracic back pain   2. Right flank pain     Deferred imaging as it  would be of low yield.  Recommended managing for musculoskeletal pain with ibuprofen, Flexeril.  No sign of an acute urologic emergency, acute abdomen.  Counseled patient on potential for adverse effects with medications prescribed/recommended today, ER and return-to-clinic precautions discussed, patient verbalized understanding.    Jaynee Eagles, [place] 04/30/22 1313

## 2022-04-30 NOTE — ED Triage Notes (Addendum)
Pt c/o right side abd/right flank pain x 4 days-pain ia worse with movement-NAD-steady gait

## 2022-04-30 NOTE — Discharge Instructions (Addendum)
If you develop fever, abdominal pain, bloody stools, bloody urine, vomiting then please report to the emergency room.

## 2022-05-13 DIAGNOSIS — Z299 Encounter for prophylactic measures, unspecified: Secondary | ICD-10-CM | POA: Diagnosis not present

## 2022-05-13 DIAGNOSIS — E7800 Pure hypercholesterolemia, unspecified: Secondary | ICD-10-CM | POA: Diagnosis not present

## 2022-05-13 DIAGNOSIS — I10 Essential (primary) hypertension: Secondary | ICD-10-CM | POA: Diagnosis not present

## 2022-05-13 DIAGNOSIS — D352 Benign neoplasm of pituitary gland: Secondary | ICD-10-CM | POA: Diagnosis not present

## 2022-05-13 DIAGNOSIS — F339 Major depressive disorder, recurrent, unspecified: Secondary | ICD-10-CM | POA: Diagnosis not present

## 2022-06-03 DIAGNOSIS — D352 Benign neoplasm of pituitary gland: Secondary | ICD-10-CM | POA: Diagnosis not present

## 2022-06-03 DIAGNOSIS — I10 Essential (primary) hypertension: Secondary | ICD-10-CM | POA: Diagnosis not present

## 2022-06-03 DIAGNOSIS — Z299 Encounter for prophylactic measures, unspecified: Secondary | ICD-10-CM | POA: Diagnosis not present

## 2022-06-24 DIAGNOSIS — R42 Dizziness and giddiness: Secondary | ICD-10-CM | POA: Diagnosis not present

## 2022-06-24 DIAGNOSIS — I10 Essential (primary) hypertension: Secondary | ICD-10-CM | POA: Diagnosis not present

## 2022-06-24 DIAGNOSIS — Z299 Encounter for prophylactic measures, unspecified: Secondary | ICD-10-CM | POA: Diagnosis not present

## 2022-06-24 DIAGNOSIS — Z6839 Body mass index (BMI) 39.0-39.9, adult: Secondary | ICD-10-CM | POA: Diagnosis not present

## 2022-06-27 DIAGNOSIS — R42 Dizziness and giddiness: Secondary | ICD-10-CM | POA: Diagnosis not present

## 2022-08-22 DIAGNOSIS — F339 Major depressive disorder, recurrent, unspecified: Secondary | ICD-10-CM | POA: Diagnosis not present

## 2022-08-22 DIAGNOSIS — R42 Dizziness and giddiness: Secondary | ICD-10-CM | POA: Diagnosis not present

## 2022-08-22 DIAGNOSIS — I10 Essential (primary) hypertension: Secondary | ICD-10-CM | POA: Diagnosis not present

## 2022-08-22 DIAGNOSIS — Z299 Encounter for prophylactic measures, unspecified: Secondary | ICD-10-CM | POA: Diagnosis not present

## 2022-10-03 DIAGNOSIS — Z6839 Body mass index (BMI) 39.0-39.9, adult: Secondary | ICD-10-CM | POA: Diagnosis not present

## 2022-10-03 DIAGNOSIS — Z0000 Encounter for general adult medical examination without abnormal findings: Secondary | ICD-10-CM | POA: Diagnosis not present

## 2022-10-03 DIAGNOSIS — Z1339 Encounter for screening examination for other mental health and behavioral disorders: Secondary | ICD-10-CM | POA: Diagnosis not present

## 2022-10-03 DIAGNOSIS — Z7189 Other specified counseling: Secondary | ICD-10-CM | POA: Diagnosis not present

## 2022-10-03 DIAGNOSIS — E039 Hypothyroidism, unspecified: Secondary | ICD-10-CM | POA: Diagnosis not present

## 2022-10-03 DIAGNOSIS — Z299 Encounter for prophylactic measures, unspecified: Secondary | ICD-10-CM | POA: Diagnosis not present

## 2022-10-03 DIAGNOSIS — I10 Essential (primary) hypertension: Secondary | ICD-10-CM | POA: Diagnosis not present

## 2022-10-03 DIAGNOSIS — Z1331 Encounter for screening for depression: Secondary | ICD-10-CM | POA: Diagnosis not present

## 2022-10-14 DIAGNOSIS — Z1211 Encounter for screening for malignant neoplasm of colon: Secondary | ICD-10-CM | POA: Diagnosis not present

## 2022-10-14 DIAGNOSIS — Z1212 Encounter for screening for malignant neoplasm of rectum: Secondary | ICD-10-CM | POA: Diagnosis not present

## 2022-10-21 LAB — COLOGUARD
COLOGUARD: NEGATIVE
Cologuard: NEGATIVE

## 2022-10-25 ENCOUNTER — Ambulatory Visit: Payer: Medicare HMO | Admitting: Family Medicine

## 2022-12-06 ENCOUNTER — Encounter: Payer: Self-pay | Admitting: Family Medicine

## 2022-12-06 ENCOUNTER — Ambulatory Visit (INDEPENDENT_AMBULATORY_CARE_PROVIDER_SITE_OTHER): Payer: Medicare HMO | Admitting: Family Medicine

## 2022-12-06 VITALS — BP 132/88 | HR 67 | Temp 98.1°F | Ht 63.78 in | Wt 217.8 lb

## 2022-12-06 DIAGNOSIS — Z7689 Persons encountering health services in other specified circumstances: Secondary | ICD-10-CM

## 2022-12-06 DIAGNOSIS — F32A Depression, unspecified: Secondary | ICD-10-CM | POA: Diagnosis not present

## 2022-12-06 DIAGNOSIS — K219 Gastro-esophageal reflux disease without esophagitis: Secondary | ICD-10-CM

## 2022-12-06 DIAGNOSIS — M51362 Other intervertebral disc degeneration, lumbar region with discogenic back pain and lower extremity pain: Secondary | ICD-10-CM | POA: Diagnosis not present

## 2022-12-06 DIAGNOSIS — F419 Anxiety disorder, unspecified: Secondary | ICD-10-CM | POA: Diagnosis not present

## 2022-12-06 DIAGNOSIS — I10 Essential (primary) hypertension: Secondary | ICD-10-CM | POA: Insufficient documentation

## 2022-12-06 DIAGNOSIS — E782 Mixed hyperlipidemia: Secondary | ICD-10-CM

## 2022-12-06 DIAGNOSIS — M797 Fibromyalgia: Secondary | ICD-10-CM

## 2022-12-06 MED ORDER — TRAMADOL HCL 50 MG PO TABS: 50.0000 mg | ORAL_TABLET | Freq: Three times a day (TID) | ORAL | 5 refills | Status: DC | PRN

## 2022-12-06 MED ORDER — BUSPIRONE HCL 15 MG PO TABS: 15.0000 mg | ORAL_TABLET | ORAL | 1 refills | Status: DC | PRN

## 2022-12-06 MED ORDER — BUPROPION HCL ER (XL) 300 MG PO TB24: 300.0000 mg | ORAL_TABLET | Freq: Every day | ORAL | 1 refills | Status: DC

## 2022-12-06 MED ORDER — PANTOPRAZOLE SODIUM 40 MG PO TBEC: 40.0000 mg | DELAYED_RELEASE_TABLET | Freq: Every day | ORAL | 1 refills | Status: DC

## 2022-12-06 MED ORDER — BACLOFEN 10 MG PO TABS: 10.0000 mg | ORAL_TABLET | Freq: Two times a day (BID) | ORAL | 1 refills | Status: DC

## 2022-12-06 MED ORDER — ROSUVASTATIN CALCIUM 5 MG PO TABS: 5.0000 mg | ORAL_TABLET | Freq: Every day | ORAL | 1 refills | Status: DC

## 2022-12-06 MED ORDER — VALSARTAN 160 MG PO TABS: 160.0000 mg | ORAL_TABLET | Freq: Every day | ORAL | 1 refills | Status: DC

## 2022-12-06 NOTE — Patient Instructions (Addendum)
Return in about 24 weeks (around 05/23/2023).        Great to see you today.  I have refilled the medication(s) we provide.   If labs were collected or images ordered, we will inform you of  results once we have received them and reviewed. We will contact you either by echart message, or telephone call.  Please give ample time to the testing facility, and our office to run,  receive and review results. Please do not call inquiring of results, even if you can see them in your chart. We will contact you as soon as we are able. If it has been over 1 week since the test was completed, and you have not yet heard from [place], then please call us.    - echart message- for normal results that have been seen by the patient already.   - telephone call: abnormal results or if patient has not viewed results in their echart.  If a referral to a specialist was entered for you, please call us in 2 weeks if you have not heard from the specialist office to schedule.

## 2022-12-06 NOTE — Progress Notes (Signed)
Patient ID: Sandy Waters, female  DOB: 1965-06-01, 57 y.o.   MRN: [id number] Patient Care Team    Relationship Specialty Notifications Start End  Natalia Leatherwood, DO PCP - General Family Medicine  12/06/22   West Bali, MD (Inactive) Consulting Physician Gastroenterology  05/17/17   Silas Flood, MD Referring Physician Obstetrics and Gynecology  12/06/22   Ander Purpura, OD  Optometry  12/06/22   Tiffany Kocher, PA-C Physician Assistant Gastroenterology  12/06/22    Comment: [facility]    Chief Complaint  Patient presents with   Establish Care    Last cologuard 1 month ago, BP meds taken at night    Subjective:  Sandy Waters is a 57 y.o.  female present for new patient establishment. All past medical history, surgical history, allergies, family history, immunizations, medications and social history were updated in the electronic medical record today. All recent labs, ED visits and hospitalizations within the last year were reviewed.  Primary hypertension Pt reports compliance with Diovan 160 mg. Blood pressures ranges at home. Patient denies chest pain, shortness of breath or lower extremity edema. . Pt is  prescribed statin.   Anxiety and depression Patient reports she is doing well on Wellbutrin 300 mg daily.  She has been on this medication for some time.  She uses BuSpar only when needed.  Fibromyalgia/Degeneration of intervertebral disc of lumbar region with discogenic back pain and lower extremity pain Patient reports she has been diagnosed with fibromyalgia around 2012.  She also experiences low back pain from her lumbar degenerative disc disease.  She uses baclofen when needed and tramadol approximately 1-2 times daily, 3 times daily with a flare.  Gastroesophageal reflux disease without esophagitis Patient reports she is compliant with Protonix 40 mg daily.      12/06/2022    1:13 PM  Depression screen PHQ 2/9  Decreased Interest 0   Down, Depressed, Hopeless 0  PHQ - 2 Score 0  Altered sleeping 1  Tired, decreased energy 1  Change in appetite 1  Feeling bad or failure about yourself  0  Trouble concentrating 0  Moving slowly or fidgety/restless 0  Suicidal thoughts 0  PHQ-9 Score 3  Difficult doing work/chores Not difficult at all      12/06/2022    1:13 PM  GAD 7 : Generalized Anxiety Score  Nervous, Anxious, on Edge 1  Control/stop worrying 0  Worry too much - different things 0  Trouble relaxing 0  Restless 0  Easily annoyed or irritable 0  Afraid - awful might happen 0  Total GAD 7 Score 1  Anxiety Difficulty Not difficult at all             12/06/2022    1:13 PM  Fall Risk   Falls in the past year? 0  Number falls in past yr: 0  Injury with Fall? 0  Risk for fall due to : No Fall Risks  Follow up Falls evaluation completed     Immunization History  Administered Date(s) Administered   Influenza, Seasonal, Injecte, Preservative Fre 01/03/2013   Influenza-Unspecified 12/18/2007   Tdap 07/05/2011    No results found.  Past Medical History:  Diagnosis Date   Acid reflux    Anxiety    Arthritis    Carpal tunnel syndrome, left upper limb 09/21/2017   She had a NCV with EMG performed on 04/06/17.  The study revealed a borderline to very mild median  nerve entrapment at the left wrist.      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   Depression    Fibromyalgia    2012   Frequent headaches    Hiatal hernia 10/14/2010   History of cholecystectomy 07/10/2014   History of stomach ulcers 09/21/2017   Hypertension    IBS (irritable bowel syndrome) 2019   Migraines 2019   Other insomnia 09/21/2017   Thyroid disease    Allergies  Allergen Reactions   Macrodantin [Nitrofurantoin Macrocrystal] Swelling    Face swells   Sulfa Antibiotics Swelling    microdantin   Latex Itching and Rash    Band aids   Past Surgical History:  Procedure  Laterality Date   CESAREAN SECTION  1986   1986   CHOLECYSTECTOMY  2010   2010   COLONOSCOPY  2014   ?2014 AND REPORTEDLY NORMAL   COLONOSCOPY  05/2006   Dr. Lissa Hoard D'Oro: few scattered ulcerations in lower rectum likely related to prep, toruous sigmoid colon difficult to pass, few diverticula in sigmoid colon.    ESOPHAGOGASTRODUODENOSCOPY  11/08/2013   Dr. Amado Coe: normal. biopsies from proximal and distal esophagus with no increased intraepithelial eosinophils, no intestinal metaplasia. gastric bx with chronic active gastritis, no H.pylori   ESOPHAGOGASTRODUODENOSCOPY  09/2010   Dr. Joycelyn Schmid: bile reflux, small hiatal hernia   ESOPHAGOGASTRODUODENOSCOPY  02/2005   Dr. Lissa Hoard D'Oro: gastritis mostly in the antrum, superficial ulcerations in the stomach, mild duodenitis, hiatal hernia. gastric bx negative.   SHOULDER SURGERY Right 08/30/2016   bone spur/scar tissue removed    TONSILLECTOMY  1984   1984   Family History  Problem Relation Age of Onset   Heart disease Mother    Hypertension Mother    Diabetes Mother    Breast cancer Mother    Depression Father    Drug abuse Father    Early death Father    Prostate cancer Father        prostate    Hypertension Sister    Hypertension Brother    Schizophrenia Brother    Diabetes Brother    Heart disease Maternal Grandmother    Scoliosis Daughter    Colon cancer Neg Hx    Colon polyps Neg Hx    Social History   Social History Narrative   Not on file    Allergies as of 12/06/2022       Reactions   Macrodantin [nitrofurantoin Macrocrystal] Swelling   Face swells   Sulfa Antibiotics Swelling   microdantin   Latex Itching, Rash   Band aids        Medication List        Accurate as of December 06, 2022  3:13 PM. If you have any questions, ask your nurse or doctor.          STOP taking these medications    amLODipine 5 MG tablet Commonly known as: NORVASC Stopped by: Felix Pacini   cyclobenzaprine 5 MG  tablet Commonly known as: FLEXERIL Stopped by: Felix Pacini   diclofenac sodium 1 % Gel Commonly known as: VOLTAREN Stopped by: Felix Pacini   ibuprofen 600 MG tablet Commonly known as: ADVIL Stopped by: Felix Pacini       TAKE these medications    B-12 5000 MCG Caps Take by mouth.   baclofen 10 MG tablet Commonly known as: LIORESAL Take 1 tablet (10 mg total) by mouth 2 (two) times  daily. What changed: when to take this Changed by: Felix Pacini   buPROPion 300 MG 24 hr tablet Commonly known as: WELLBUTRIN XL Take 1 tablet (300 mg total) by mouth daily. What changed: how much to take Changed by: Felix Pacini   busPIRone 15 MG tablet Commonly known as: BUSPAR Take 1 tablet (15 mg total) by mouth as needed.   D3 PO Take by mouth.   magnesium oxide 400 (240 Mg) MG tablet Commonly known as: MAG-OX Take 400 mg by mouth daily.   meclizine 25 MG tablet Commonly known as: ANTIVERT Take 25 mg by mouth 2 (two) times daily as needed.   pantoprazole 40 MG tablet Commonly known as: PROTONIX Take 1 tablet (40 mg total) by mouth daily.   rosuvastatin 5 MG tablet Commonly known as: CRESTOR Take 1 tablet (5 mg total) by mouth at bedtime. What changed: See the new instructions. Changed by: Felix Pacini   traMADol 50 MG tablet Commonly known as: ULTRAM Take 1 tablet (50 mg total) by mouth 3 (three) times daily as needed. For pain   valsartan 160 MG tablet Commonly known as: DIOVAN Take 1 tablet (160 mg total) by mouth daily.        All past medical history, surgical history, allergies, family history, immunizations andmedications were updated in the EMR today and reviewed under the history and medication portions of their EMR.    No results found for this or any previous visit (from the past 2160 hour(s)).  No results found.   ROS 14 pt review of systems performed and negative (unless mentioned in an HPI)  Objective: BP 132/88   Pulse 67   Temp 98.1 F  (36.7 C)   Ht 5' 3.78" (1.62 m)   Wt 217 lb 12.8 oz (98.8 kg)   SpO2 95%   BMI 37.64 kg/m  Physical Exam Vitals and nursing note reviewed.  Constitutional:      General: She is not in acute distress.    Appearance: Normal appearance. She is not ill-appearing, toxic-appearing or diaphoretic.  HENT:     Head: Normocephalic and atraumatic.  Eyes:     General: No scleral icterus.       Right eye: No discharge.        Left eye: No discharge.     Extraocular Movements: Extraocular movements intact.     Conjunctiva/sclera: Conjunctivae normal.     Pupils: Pupils are equal, round, and reactive to light.  Cardiovascular:     Rate and Rhythm: Normal rate and regular rhythm.     Heart sounds: No murmur heard. Pulmonary:     Effort: Pulmonary effort is normal. No respiratory distress.     Breath sounds: Normal breath sounds. No wheezing, rhonchi or rales.  Musculoskeletal:     Cervical back: Neck supple.     Right lower leg: No edema.     Left lower leg: No edema.  Skin:    General: Skin is warm.     Findings: No rash.  Neurological:     Mental Status: She is alert and oriented to person, place, and time. Mental status is at baseline.     Motor: No weakness.     Gait: Gait normal.  Psychiatric:        Mood and Affect: Mood normal.        Behavior: Behavior normal.        Thought Content: Thought content normal.        Judgment: Judgment normal.  Assessment/plan: Sandy Waters is a 57 y.o. female present for est care/Chronic Conditions/illness Management Establishing care with new doctor, encounter for Primary hypertension/hyperlipidemia Continue Diovan 160 mg daily.  May consider increasing next visit - Comp Met (CMET) -Routine exercise - Low-sodium diet  Anxiety and depression Stable. Continue Wellbutrin 300 mg daily Continue BuSpar 15 mg daily as needed  Fibromyalgia/Degeneration of intervertebral disc of lumbar region with discogenic back pain and lower  extremity pain Stable Continue baclofen 10 mg twice daily as needed Continue tramadol 50 mg 2-3 times daily as needed  Gastroesophageal reflux disease without esophagitis Stable Continue Protonix 40 mg daily. Monitor B12, vitamin D and mag every 2-3 years.  Return in about 24 weeks (around 05/23/2023).  Orders Placed This Encounter  Procedures   Comp Met (CMET)   Meds ordered this encounter  Medications   buPROPion (WELLBUTRIN XL) 300 MG 24 hr tablet    Sig: Take 1 tablet (300 mg total) by mouth daily.    Dispense:  90 tablet    Refill:  1   busPIRone (BUSPAR) 15 MG tablet    Sig: Take 1 tablet (15 mg total) by mouth as needed.    Dispense:  90 tablet    Refill:  1   baclofen (LIORESAL) 10 MG tablet    Sig: Take 1 tablet (10 mg total) by mouth 2 (two) times daily.    Dispense:  180 each    Refill:  1   rosuvastatin (CRESTOR) 5 MG tablet    Sig: Take 1 tablet (5 mg total) by mouth at bedtime.    Dispense:  90 tablet    Refill:  1   pantoprazole (PROTONIX) 40 MG tablet    Sig: Take 1 tablet (40 mg total) by mouth daily.    Dispense:  90 tablet    Refill:  1   valsartan (DIOVAN) 160 MG tablet    Sig: Take 1 tablet (160 mg total) by mouth daily.    Dispense:  90 tablet    Refill:  1   traMADol (ULTRAM) 50 MG tablet    Sig: Take 1 tablet (50 mg total) by mouth 3 (three) times daily as needed. For pain    Dispense:  90 tablet    Refill:  5   Referral Orders  No referral(s) requested today     Note is dictated utilizing voice recognition software. Although note has been proof read prior to signing, occasional typographical errors still can be missed. If any questions arise, please do not hesitate to call for verification.  Electronically signed by: Felix Pacini, DO [facility] [place]

## 2022-12-07 LAB — COMPREHENSIVE METABOLIC PANEL
ALT: 46 U/L — ABNORMAL HIGH (ref 0–35)
AST: 42 U/L — ABNORMAL HIGH (ref 0–37)
Albumin: 4.4 g/dL (ref 3.5–5.2)
Alkaline Phosphatase: 82 U/L (ref 39–117)
BUN: 12 mg/dL (ref 6–23)
CO2: 24 meq/L (ref 19–32)
Calcium: 9.7 mg/dL (ref 8.4–10.5)
Chloride: 106 meq/L (ref 96–112)
Creatinine, Ser: 0.98 mg/dL (ref 0.40–1.20)
GFR: 63.25 mL/min (ref >60.00)
Glucose, Bld: 141 mg/dL — ABNORMAL HIGH (ref 70–99)
Potassium: 4.1 meq/L (ref 3.5–5.1)
Sodium: 141 meq/L (ref 135–145)
Total Bilirubin: 0.4 mg/dL (ref 0.2–1.2)
Total Protein: 7.2 g/dL (ref 6.0–8.3)

## 2022-12-09 ENCOUNTER — Telehealth: Payer: Self-pay | Admitting: Family Medicine

## 2022-12-09 NOTE — Telephone Encounter (Signed)
Please give the patient a call to discuss lab results she has concerns about A1C

## 2022-12-12 NOTE — Telephone Encounter (Signed)
Spoke with patient regarding results/recommendations.

## 2022-12-20 ENCOUNTER — Ambulatory Visit: Payer: Medicare HMO | Admitting: Family Medicine

## 2023-01-11 ENCOUNTER — Ambulatory Visit: Payer: Medicare HMO

## 2023-01-23 ENCOUNTER — Encounter: Payer: Medicare HMO | Admitting: Family Medicine

## 2023-02-13 ENCOUNTER — Ambulatory Visit: Payer: Medicare HMO | Admitting: Family Medicine

## 2023-02-13 ENCOUNTER — Encounter: Payer: Self-pay | Admitting: Family Medicine

## 2023-02-13 VITALS — BP 130/84 | HR 69 | Temp 98.3°F | Ht 64.17 in | Wt 217.8 lb

## 2023-02-13 DIAGNOSIS — K219 Gastro-esophageal reflux disease without esophagitis: Secondary | ICD-10-CM

## 2023-02-13 DIAGNOSIS — Z0000 Encounter for general adult medical examination without abnormal findings: Secondary | ICD-10-CM

## 2023-02-13 DIAGNOSIS — I10 Essential (primary) hypertension: Secondary | ICD-10-CM | POA: Diagnosis not present

## 2023-02-13 DIAGNOSIS — F32A Depression, unspecified: Secondary | ICD-10-CM

## 2023-02-13 DIAGNOSIS — E559 Vitamin D deficiency, unspecified: Secondary | ICD-10-CM

## 2023-02-13 DIAGNOSIS — R7303 Prediabetes: Secondary | ICD-10-CM

## 2023-02-13 DIAGNOSIS — F419 Anxiety disorder, unspecified: Secondary | ICD-10-CM | POA: Diagnosis not present

## 2023-02-13 DIAGNOSIS — Z114 Encounter for screening for human immunodeficiency virus [HIV]: Secondary | ICD-10-CM | POA: Diagnosis not present

## 2023-02-13 DIAGNOSIS — M170 Bilateral primary osteoarthritis of knee: Secondary | ICD-10-CM

## 2023-02-13 DIAGNOSIS — M797 Fibromyalgia: Secondary | ICD-10-CM | POA: Diagnosis not present

## 2023-02-13 DIAGNOSIS — Z803 Family history of malignant neoplasm of breast: Secondary | ICD-10-CM

## 2023-02-13 DIAGNOSIS — M51362 Other intervertebral disc degeneration, lumbar region with discogenic back pain and lower extremity pain: Secondary | ICD-10-CM | POA: Diagnosis not present

## 2023-02-13 DIAGNOSIS — Z1231 Encounter for screening mammogram for malignant neoplasm of breast: Secondary | ICD-10-CM

## 2023-02-13 DIAGNOSIS — Z1159 Encounter for screening for other viral diseases: Secondary | ICD-10-CM

## 2023-02-13 DIAGNOSIS — E782 Mixed hyperlipidemia: Secondary | ICD-10-CM

## 2023-02-13 DIAGNOSIS — Z23 Encounter for immunization: Secondary | ICD-10-CM

## 2023-02-13 DIAGNOSIS — Z8249 Family history of ischemic heart disease and other diseases of the circulatory system: Secondary | ICD-10-CM

## 2023-02-13 LAB — COMPREHENSIVE METABOLIC PANEL
ALT: 54 U/L — ABNORMAL HIGH (ref 0–35)
AST: 46 U/L — ABNORMAL HIGH (ref 0–37)
Albumin: 4.4 g/dL (ref 3.5–5.2)
Alkaline Phosphatase: 74 U/L (ref 39–117)
BUN: 9 mg/dL (ref 6–23)
CO2: 23 meq/L (ref 19–32)
Calcium: 9.2 mg/dL (ref 8.4–10.5)
Chloride: 107 meq/L (ref 96–112)
Creatinine, Ser: 1.08 mg/dL (ref 0.40–1.20)
GFR: 56.21 mL/min — ABNORMAL LOW (ref >60.00)
Glucose, Bld: 135 mg/dL — ABNORMAL HIGH (ref 70–99)
Potassium: 3.8 meq/L (ref 3.5–5.1)
Sodium: 141 meq/L (ref 135–145)
Total Bilirubin: 0.7 mg/dL (ref 0.2–1.2)
Total Protein: 7.5 g/dL (ref 6.0–8.3)

## 2023-02-13 LAB — CBC
HCT: 42.8 % (ref 36.0–46.0)
Hemoglobin: 14.3 g/dL (ref 12.0–15.0)
MCHC: 33.3 g/dL (ref 30.0–36.0)
MCV: 89.8 fL (ref 78.0–100.0)
Platelets: 324 10*3/uL (ref 150.0–400.0)
RBC: 4.77 Mil/uL (ref 3.87–5.11)
RDW: 13 % (ref 11.5–15.5)
WBC: 5.9 10*3/uL (ref 4.0–10.5)

## 2023-02-13 LAB — LIPID PANEL
Cholesterol: 220 mg/dL — ABNORMAL HIGH (ref 0–200)
HDL: 43.8 mg/dL (ref >39.00)
LDL Cholesterol: 135 mg/dL — ABNORMAL HIGH (ref 0–99)
NonHDL: 176.16
Total CHOL/HDL Ratio: 5
Triglycerides: 205 mg/dL — ABNORMAL HIGH (ref 0.0–149.0)
VLDL: 41 mg/dL — ABNORMAL HIGH (ref 0.0–40.0)

## 2023-02-13 LAB — TSH: TSH: 5.27 u[IU]/mL (ref 0.35–5.50)

## 2023-02-13 LAB — HEMOGLOBIN A1C: Hgb A1c MFr Bld: 6.6 % — ABNORMAL HIGH (ref 4.6–6.5)

## 2023-02-13 LAB — VITAMIN D 25 HYDROXY (VIT D DEFICIENCY, FRACTURES): VITD: 73.92 ng/mL (ref 30.00–100.00)

## 2023-02-13 MED ORDER — BACLOFEN 10 MG PO TABS: 10.0000 mg | ORAL_TABLET | Freq: Two times a day (BID) | ORAL | 1 refills | Status: DC

## 2023-02-13 MED ORDER — PANTOPRAZOLE SODIUM 40 MG PO TBEC: 40.0000 mg | DELAYED_RELEASE_TABLET | Freq: Every day | ORAL | 1 refills | Status: DC

## 2023-02-13 MED ORDER — ZOSTER VAC RECOMB ADJUVANTED 50 MCG/0.5ML IM SUSR: 0.5000 mL | Freq: Once | INTRAMUSCULAR | 1 refills | Status: AC

## 2023-02-13 MED ORDER — TETANUS-DIPHTH-ACELL PERTUSSIS 5-2.5-18.5 LF-MCG/0.5 IM SUSP: 0.5000 mL | Freq: Once | INTRAMUSCULAR | 0 refills | Status: AC

## 2023-02-13 MED ORDER — BUSPIRONE HCL 15 MG PO TABS: 15.0000 mg | ORAL_TABLET | ORAL | 1 refills | Status: DC | PRN

## 2023-02-13 MED ORDER — VALSARTAN 160 MG PO TABS: 160.0000 mg | ORAL_TABLET | Freq: Every day | ORAL | 1 refills | Status: DC

## 2023-02-13 MED ORDER — ROSUVASTATIN CALCIUM 5 MG PO TABS: 5.0000 mg | ORAL_TABLET | Freq: Every day | ORAL | 1 refills | Status: DC

## 2023-02-13 MED ORDER — BUPROPION HCL ER (XL) 300 MG PO TB24: 300.0000 mg | ORAL_TABLET | Freq: Every day | ORAL | 1 refills | Status: DC

## 2023-02-13 MED ORDER — TRAMADOL HCL 50 MG PO TABS: 50.0000 mg | ORAL_TABLET | Freq: Three times a day (TID) | ORAL | 5 refills | Status: DC | PRN

## 2023-02-13 NOTE — Progress Notes (Signed)
Patient ID: Sandy Waters, female  DOB: 22-Mar-1965, 57 y.o.   MRN: [id number] Patient Care Team    Relationship Specialty Notifications Start End  Natalia Leatherwood, DO PCP - General Family Medicine  12/06/22   West Bali, MD (Inactive) Consulting Physician Gastroenterology  05/17/17   Silas Flood, MD Referring Physician Obstetrics and Gynecology  12/06/22   Ander Purpura, OD  Optometry  12/06/22   Tiffany Kocher, PA-C Physician Assistant Gastroenterology  12/06/22    Comment: [facility]    Chief Complaint  Patient presents with   Annual Exam    Chronic Conditions/illness Management Pt is fasting    Subjective: Sandy Waters is a 57 y.o.  female present for annual physical with combined chronic condition management appointment All past medical history, surgical history, allergies, family history, immunizations, medications and social history were updated in the electronic medical record today. All recent labs, ED visits and hospitalizations within the last year were reviewed.  Health maintenance:  Colonoscopy: No family history. 09/2022- cologuard - negative Mammogram: Family history of breast cancer-mother.  Mammogram 12/2021> [facility]>ordered Cervical cancer screening: UTD 10/02/2019-gynecology, 5-year follow-up Immunizations: Tdap-printed, influenza-dclined, Shingrix-printed, Infectious disease screening: HIV and hepatitis C- pt agreeable to screen to day,  Primary hypertension Pt reports compliance with Diovan 160 mg. Blood pressures ranges at home. Patient denies chest pain, shortness of breath, dizziness or lower extremity edema.  Pt is  prescribed statin.   Anxiety and depression Patient reports compliance Wellbutrin 300 mg daily.  She has been on this medication for some time.  She uses BuSpar only when needed.  Fibromyalgia/Degeneration of intervertebral disc of lumbar region with discogenic back pain and lower extremity pain Patient reports she has  been diagnosed with fibromyalgia around 2012.  She also experiences low back pain from her lumbar degenerative disc disease.  She uses baclofen when needed and tramadol approximately 1-2 times daily, 3 times daily with a flare.  Gastroesophageal reflux disease without esophagitis Patient reports she is compliant with Protonix 40 mg daily.      02/13/2023    1:53 PM 12/06/2022    1:13 PM  Depression screen PHQ 2/9  Decreased Interest 1 0  Down, Depressed, Hopeless 0 0  PHQ - 2 Score 1 0  Altered sleeping  1  Tired, decreased energy  1  Change in appetite  1  Feeling bad or failure about yourself   0  Trouble concentrating  0  Moving slowly or fidgety/restless  0  Suicidal thoughts  0  PHQ-9 Score  3  Difficult doing work/chores  Not difficult at all      12/06/2022    1:13 PM  GAD 7 : Generalized Anxiety Score  Nervous, Anxious, on Edge 1  Control/stop worrying 0  Worry too much - different things 0  Trouble relaxing 0  Restless 0  Easily annoyed or irritable 0  Afraid - awful might happen 0  Total GAD 7 Score 1  Anxiety Difficulty Not difficult at all           02/13/2023    1:53 PM 12/06/2022    1:13 PM  Fall Risk   Falls in the past year? 0 0  Number falls in past yr: 0 0  Injury with Fall? 0 0  Risk for fall due to : No Fall Risks No Fall Risks  Follow up Falls evaluation completed Falls evaluation completed    Immunization History  Administered Date(s) Administered  Influenza, Seasonal, Injecte, Preservative Fre 01/03/2013   Influenza-Unspecified 12/18/2007   Tdap 07/05/2011    No results found.  Past Medical History:  Diagnosis Date   Acid reflux    Anxiety    Arthritis    Carpal tunnel syndrome, left upper limb 09/21/2017   She had a NCV with EMG performed on 04/06/17.  The study revealed a borderline to very mild median nerve entrapment at the left wrist.      Cellulitis 10/29/2009   Chronic low back pain    Chronic right SI joint pain  09/21/2017   Depression    Fibromyalgia    2012   Frequent headaches    Hiatal hernia 10/14/2010   History of cholecystectomy 07/10/2014   History of stomach ulcers 09/21/2017   Hypertension    IBS (irritable bowel syndrome) 2019   Migraines 2019   Other insomnia 09/21/2017   Thyroid disease    Allergies  Allergen Reactions   Macrodantin [Nitrofurantoin Macrocrystal] Swelling    Face swells   Sulfa Antibiotics Swelling    microdantin   Latex Itching and Rash    Band aids   Past Surgical History:  Procedure Laterality Date   CESAREAN SECTION  1986   1986   CHOLECYSTECTOMY  2010   2010   COLONOSCOPY  2014   ?2014 AND REPORTEDLY NORMAL   COLONOSCOPY  05/2006   Dr. Lissa Hoard D'Oro: few scattered ulcerations in lower rectum likely related to prep, toruous sigmoid colon difficult to pass, few diverticula in sigmoid colon.    ESOPHAGOGASTRODUODENOSCOPY  11/08/2013   Dr. Amado Coe: normal. biopsies from proximal and distal esophagus with no increased intraepithelial eosinophils, no intestinal metaplasia. gastric bx with chronic active gastritis, no H.pylori   ESOPHAGOGASTRODUODENOSCOPY  09/2010   Dr. Joycelyn Schmid: bile reflux, small hiatal hernia   ESOPHAGOGASTRODUODENOSCOPY  02/2005   Dr. Lissa Hoard D'Oro: gastritis mostly in the antrum, superficial ulcerations in the stomach, mild duodenitis, hiatal hernia. gastric bx negative.   SHOULDER SURGERY Right 08/30/2016   bone spur/scar tissue removed    TONSILLECTOMY  1984   1984   Family History  Problem Relation Age of Onset   Heart disease Mother    Hypertension Mother    Diabetes Mother    Breast cancer Mother    Depression Father    Drug abuse Father    Early death Father    Prostate cancer Father        prostate    Hypertension Sister    Hypertension Brother    Schizophrenia Brother    Diabetes Brother    Heart disease Maternal Grandmother    Scoliosis Daughter    Colon cancer Neg Hx    Colon polyps Neg Hx    Social History    Social History Narrative   Not on file    Allergies as of 02/13/2023       Reactions   Macrodantin [nitrofurantoin Macrocrystal] Swelling   Face swells   Sulfa Antibiotics Swelling   microdantin   Latex Itching, Rash   Band aids        Medication List        Accurate as of February 13, 2023  2:27 PM. If you have any questions, ask your nurse or doctor.          B-12 5000 MCG Caps Take by mouth.   baclofen 10 MG tablet Commonly known as: LIORESAL Take 1 tablet (10 mg total) by mouth 2 (two) times daily.   buPROPion  300 MG 24 hr tablet Commonly known as: WELLBUTRIN XL Take 1 tablet (300 mg total) by mouth daily.   busPIRone 15 MG tablet Commonly known as: BUSPAR Take 1 tablet (15 mg total) by mouth as needed.   D3 PO Take by mouth.   magnesium oxide 400 (240 Mg) MG tablet Commonly known as: MAG-OX Take 400 mg by mouth daily.   meclizine 25 MG tablet Commonly known as: ANTIVERT Take 25 mg by mouth 2 (two) times daily as needed.   pantoprazole 40 MG tablet Commonly known as: PROTONIX Take 1 tablet (40 mg total) by mouth daily.   rosuvastatin 5 MG tablet Commonly known as: CRESTOR Take 1 tablet (5 mg total) by mouth at bedtime.   Tdap 5-2.5-18.5 LF-MCG/0.5 injection Commonly known as: BOOSTRIX Inject 0.5 mLs into the muscle once for 1 dose. Started by: Felix Pacini   traMADol 50 MG tablet Commonly known as: ULTRAM Take 1 tablet (50 mg total) by mouth 3 (three) times daily as needed. For pain   valsartan 160 MG tablet Commonly known as: DIOVAN Take 1 tablet (160 mg total) by mouth daily.   Zoster Vaccine Adjuvanted injection Commonly known as: SHINGRIX Inject 0.5 mLs into the muscle once for 1 dose. Started by: Felix Pacini        All past medical history, surgical history, allergies, family history, immunizations andmedications were updated in the EMR today and reviewed under the history and medication portions of their EMR.      No  results found.   ROS 14 pt review of systems performed and negative (unless mentioned in an HPI)  Objective: BP 130/84   Pulse 69   Temp 98.3 F (36.8 C)   Ht 5' 4.17" (1.63 m)   Wt 217 lb 12.8 oz (98.8 kg)   SpO2 96%   BMI 37.18 kg/m  Physical Exam Vitals and nursing note reviewed.  Constitutional:      General: She is not in acute distress.    Appearance: Normal appearance. She is not ill-appearing or toxic-appearing.  HENT:     Head: Normocephalic and atraumatic.     Right Ear: Tympanic membrane, ear canal and external ear normal. There is no impacted cerumen.     Left Ear: Tympanic membrane, ear canal and external ear normal. There is no impacted cerumen.     Nose: No congestion or rhinorrhea.     Mouth/Throat:     Mouth: Mucous membranes are moist.     Pharynx: Oropharynx is clear. No oropharyngeal exudate or posterior oropharyngeal erythema.  Eyes:     General: No scleral icterus.       Right eye: No discharge.        Left eye: No discharge.     Extraocular Movements: Extraocular movements intact.     Conjunctiva/sclera: Conjunctivae normal.     Pupils: Pupils are equal, round, and reactive to light.  Cardiovascular:     Rate and Rhythm: Normal rate and regular rhythm.     Pulses: Normal pulses.     Heart sounds: Normal heart sounds. No murmur heard.    No friction rub. No gallop.  Pulmonary:     Effort: Pulmonary effort is normal. No respiratory distress.     Breath sounds: Normal breath sounds. No stridor. No wheezing, rhonchi or rales.  Chest:     Chest wall: No tenderness.  Abdominal:     General: Abdomen is flat. Bowel sounds are normal. There is no distension.  Palpations: Abdomen is soft. There is no mass.     Tenderness: There is no abdominal tenderness. There is no right CVA tenderness, left CVA tenderness, guarding or rebound.     Hernia: No hernia is present.  Musculoskeletal:        General: No swelling, tenderness or deformity. Normal range of  motion.     Cervical back: Normal range of motion and neck supple. No rigidity or tenderness.     Right lower leg: No edema.     Left lower leg: No edema.  Lymphadenopathy:     Cervical: No cervical adenopathy.  Skin:    General: Skin is warm and dry.     Coloration: Skin is not jaundiced or pale.     Findings: No bruising, erythema, lesion or rash.  Neurological:     General: No focal deficit present.     Mental Status: She is alert and oriented to person, place, and time. Mental status is at baseline.     Cranial Nerves: No cranial nerve deficit.     Sensory: No sensory deficit.     Motor: No weakness.     Coordination: Coordination normal.     Gait: Gait normal.     Deep Tendon Reflexes: Reflexes normal.  Psychiatric:        Mood and Affect: Mood normal.        Behavior: Behavior normal.        Thought Content: Thought content normal.        Judgment: Judgment normal.     Assessment/plan: Sandy Waters is a 57 y.o. female present for annual physical with combined chronic condition management Primary hypertension/hyperlipidemia/family history of heart disease Stable Continue Diovan 160 mg daily. -Routine exercise - Low-sodium diet CBC, CMP, TSH, lipids and A1c collected today  Anxiety and depression Stable Continue Wellbutrin 300 mg daily Continue BuSpar 15 mg daily as needed  Fibromyalgia/Degeneration of intervertebral disc of lumbar region with discogenic back pain and lower extremity pain Stable Continue baclofen 10 mg twice daily as needed Continue tramadol 50 mg 2-3 times daily as needed [place] controlled substance database reviewed and appropriate today  Gastroesophageal reflux disease without esophagitis Stable Continue Protonix 40 mg daily. Monitor B12, vitamin D and mag every 2-3 years.  Breast cancer screening by mammogram/family history of breast cancer in mother - MM 3D SCREENING MAMMOGRAM BILATERAL BREAST; Future -Mammograms completed in  [place] Prediabetes - Hemoglobin A1c Vitamin D deficiency - Vitamin D (25 hydroxy) Encounter for hepatitis C screening test for low risk patient - Hepatitis C Antibody Encounter for screening for HIV - HIV antibody (with reflex) Influenza vaccine needed Declined Colon cancer screening Completed Cologuard 09/2022-negative result  Routine general medical examination at a health care facility (Primary) Patient was encouraged to exercise greater than 150 minutes a week. Patient was encouraged to choose a diet filled with fresh fruits and vegetables, and lean meats. AVS provided to patient today for education/recommendation on gender specific health and safety maintenance. Colonoscopy: No family history. 09/2022- cologuard - negative Mammogram: Family history of breast cancer-mother.  Mammogram 12/2021> [facility]>ordered Cervical cancer screening: UTD 10/02/2019-gynecology, 5-year follow-up Immunizations: Tdap-printed, influenza-dclined, Shingrix-printed, Infectious disease screening: HIV and hepatitis C- pt agreeable to screen to day,  Return in about 30 weeks (around 09/11/2023) for Routine chronic condition follow-up.  Orders Placed This Encounter  Procedures   MM 3D SCREENING MAMMOGRAM BILATERAL BREAST   Hepatitis C Antibody   HIV antibody (with reflex)   CBC  Comp Met (CMET)   TSH   Lipid panel   Hemoglobin A1c   Vitamin D (25 hydroxy)   Meds ordered this encounter  Medications   valsartan (DIOVAN) 160 MG tablet    Sig: Take 1 tablet (160 mg total) by mouth daily.    Dispense:  90 tablet    Refill:  1   traMADol (ULTRAM) 50 MG tablet    Sig: Take 1 tablet (50 mg total) by mouth 3 (three) times daily as needed. For pain    Dispense:  90 tablet    Refill:  5   rosuvastatin (CRESTOR) 5 MG tablet    Sig: Take 1 tablet (5 mg total) by mouth at bedtime.    Dispense:  90 tablet    Refill:  1   pantoprazole (PROTONIX) 40 MG tablet    Sig: Take 1 tablet (40 mg total) by mouth daily.     Dispense:  90 tablet    Refill:  1   busPIRone (BUSPAR) 15 MG tablet    Sig: Take 1 tablet (15 mg total) by mouth as needed.    Dispense:  90 tablet    Refill:  1   buPROPion (WELLBUTRIN XL) 300 MG 24 hr tablet    Sig: Take 1 tablet (300 mg total) by mouth daily.    Dispense:  90 tablet    Refill:  1   Tdap (BOOSTRIX) 5-2.5-18.5 LF-MCG/0.5 injection    Sig: Inject 0.5 mLs into the muscle once for 1 dose.    Dispense:  0.5 mL    Refill:  0   Zoster Vaccine Adjuvanted [facility]) injection    Sig: Inject 0.5 mLs into the muscle once for 1 dose.    Dispense:  1 each    Refill:  1    Rpt dose once 2-6 mos after 1st injection   baclofen (LIORESAL) 10 MG tablet    Sig: Take 1 tablet (10 mg total) by mouth 2 (two) times daily.    Dispense:  180 each    Refill:  1   Referral Orders  No referral(s) requested today     Note is dictated utilizing voice recognition software. Although note has been proof read prior to signing, occasional typographical errors still can be missed. If any questions arise, please do not hesitate to call for verification.  Electronically signed by: Felix Pacini, DO [facility]

## 2023-02-13 NOTE — Patient Instructions (Addendum)
Return in about 6 months (around 08/14/2023) for Routine chronic condition follow-up.   Zepbound or wegovy for weight loss; Body mass index is 37.18 kg/m.      Great to see you today.  I have refilled the medication(s) we provide.   If labs were collected or images ordered, we will inform you of  results once we have received them and reviewed. We will contact you either by echart message, or telephone call.  Please give ample time to the testing facility, and our office to run,  receive and review results. Please do not call inquiring of results, even if you can see them in your chart. We will contact you as soon as we are able. If it has been over 1 week since the test was completed, and you have not yet heard from [place], then please call us.    - echart message- for normal results that have been seen by the patient already.   - telephone call: abnormal results or if patient has not viewed results in their echart.  If a referral to a specialist was entered for you, please call us in 2 weeks if you have not heard from the specialist office to schedule.

## 2023-02-14 ENCOUNTER — Encounter: Payer: Self-pay | Admitting: Family Medicine

## 2023-02-14 ENCOUNTER — Telehealth: Payer: Self-pay | Admitting: Family Medicine

## 2023-02-14 ENCOUNTER — Other Ambulatory Visit: Payer: Self-pay | Admitting: Family Medicine

## 2023-02-14 LAB — HEPATITIS C ANTIBODY: Hepatitis C Ab: NONREACTIVE

## 2023-02-14 LAB — HIV ANTIBODY (ROUTINE TESTING W REFLEX): HIV 1&2 Ab, 4th Generation: NONREACTIVE

## 2023-02-14 MED ORDER — MOUNJARO 5 MG/0.5ML ~~LOC~~ SOAJ: 5.0000 mg | SUBCUTANEOUS | 4 refills | Status: DC

## 2023-02-14 MED ORDER — MOUNJARO 2.5 MG/0.5ML ~~LOC~~ SOAJ: 2.5000 mg | SUBCUTANEOUS | 0 refills | Status: DC

## 2023-02-14 NOTE — Telephone Encounter (Signed)
Please call patient: Kidney  function is stable. Thyroid function is normal. Blood cell counts and electrolytes are normal. Liver function tests are consistently mildly elevated.  Stable from last collection a few months ago.  These findings are consistent with the "hepatic steatosis, also called fatty liver disease" that was noted on a abdominal image study she had March 2019.  Cholesterol panel is mildly above goal with an LDL of 135 and a mildly elevated triglyceride level.  Goal for her would be less than 100 LDL with normal triglycerides. A1c is elevated to 6.6, with a glucose of 135-this places her in the diabetic range.  Since she is now in the diabetic range, I have called in [facility] weekly injection 2.5.  This dose is taken for 4 weeks and then Mounjaro 5 mg weekly injection is started. Greggory Keen is prescribed for diabetes treatment.  These types of meds have also shown to aid in fatty liver disease.  Coincidently, it is the same medication as Zepbound which is used for weight loss treatment.  Hopefully insurance will cover this for her diabetes. Once she is able to pick up this medication, she is to call and make a nurse visit for  instruction on proper injection technique.  Once she starts the injection, I recommend she follow-up in 3 weeks with provider to ensure she is tolerating and discuss her new onset diabetes ,nutrition, fatty liver disease and discuss in more detail her elevated liver enzymes.

## 2023-02-16 ENCOUNTER — Other Ambulatory Visit (HOSPITAL_COMMUNITY): Payer: Self-pay

## 2023-02-16 ENCOUNTER — Telehealth: Payer: Self-pay

## 2023-02-16 NOTE — Telephone Encounter (Signed)
Pharmacy Patient Advocate Encounter  Received notification from [facility] that Prior Authorization for Mounjaro 2.5MG /0.5ML auto-injectors has been APPROVED from 02/28/22 to 02/28/24. Ran test claim, Copay is $45. This test claim was processed through [facility]- copay amounts may vary at other pharmacies due to pharmacy/plan contracts, or as the patient moves through the different stages of their insurance plan.   PA #/Case ID/Reference #: [id number]

## 2023-02-16 NOTE — Telephone Encounter (Signed)
Pharmacy Patient Advocate Encounter   Received notification from RX Request Messages that prior authorization for Mounjaro 2.5MG /0.5ML auto-injectors is required/requested.   Insurance verification completed.   The patient is insured through [employer] .   Per test claim: PA required; PA submitted to above mentioned insurance via CoverMyMeds Key/confirmation #/EOC [id number] Status is pending

## 2023-02-17 NOTE — Telephone Encounter (Signed)
 PA has been submitted and documented in separate encounter, please sign off on rx in this encounter as PA team is unable to resolve RX requests. Thank you

## 2023-02-20 ENCOUNTER — Telehealth: Payer: Self-pay

## 2023-02-20 NOTE — Telephone Encounter (Signed)
Copied from CRM [id number]. Topic: Clinical - Prescription Issue >> Feb 20, 2023 11:03 AM Isabell A wrote: Reason for CRM: Patient is calling in regard to [facility] 2.5 MG/0.5ML Pen, states she checked with her pharmacy and they stated it has not been approved by the doctor.   Callback number: [phone] >> Feb 20, 2023 11:35 AM CMA Jimmy Footman wrote: Motsinger, Chloe A, CMA to [facility]     02/16/23  4:11 PM We wanted to inform you that your recent Prior Authorization for the medication Mounjaro 2.5MG /0.5ML auto-injectors has been APPROVED from 02/28/22 to 02/28/24. Ran test claim, Copay is $45.  This prescription should now be available for you. Please let us know if you have any other questions/concerns.    Sincerely,  [employer]  Last read by Verneda Skill at 12:45 PM on 02/19/2023.

## 2023-02-27 ENCOUNTER — Telehealth (HOSPITAL_BASED_OUTPATIENT_CLINIC_OR_DEPARTMENT_OTHER): Payer: Self-pay | Admitting: Family Medicine

## 2023-02-27 ENCOUNTER — Inpatient Hospital Stay (HOSPITAL_BASED_OUTPATIENT_CLINIC_OR_DEPARTMENT_OTHER): Admission: RE | Admit: 2023-02-27 | Payer: Medicare HMO | Source: Ambulatory Visit

## 2023-03-03 ENCOUNTER — Ambulatory Visit (HOSPITAL_BASED_OUTPATIENT_CLINIC_OR_DEPARTMENT_OTHER)
Admission: RE | Admit: 2023-03-03 | Discharge: 2023-03-03 | Disposition: A | Discharge status: Home / Self Care | Payer: Medicare HMO | Source: Ambulatory Visit | Attending: Family Medicine | Admitting: Family Medicine

## 2023-03-03 ENCOUNTER — Ambulatory Visit (INDEPENDENT_AMBULATORY_CARE_PROVIDER_SITE_OTHER): Payer: Medicare HMO | Admitting: Family Medicine

## 2023-03-03 VITALS — BP 104/74 | HR 74 | Temp 97.8°F | Resp 18 | Ht 62.0 in | Wt 209.6 lb

## 2023-03-03 DIAGNOSIS — M62838 Other muscle spasm: Secondary | ICD-10-CM | POA: Diagnosis not present

## 2023-03-03 DIAGNOSIS — M542 Cervicalgia: Secondary | ICD-10-CM | POA: Diagnosis not present

## 2023-03-03 MED ORDER — ONDANSETRON HCL 4 MG PO TABS: 4.0000 mg | ORAL_TABLET | Freq: Three times a day (TID) | ORAL | 0 refills | Status: DC | PRN

## 2023-03-03 NOTE — Progress Notes (Signed)
 Sandy Waters , Jul 26, 1965, 58 y.o., female MRN: [id number] Patient Care Team    Relationship Specialty Notifications Start End  Catherine Charlies LABOR, DO PCP - General Family Medicine  12/06/22   Harvey Margo CROME, MD (Inactive) Consulting Physician Gastroenterology  05/17/17   May Commander, MD Referring Physician Obstetrics and Gynecology  12/06/22   Joshua Rush, OD  Optometry  12/06/22   Ezzard Sonny RAMAN, PA-C Physician Assistant Gastroenterology  12/06/22    Comment: [facility]    Chief Complaint  Patient presents with   back and neck pain    She has tried Tramadol , Ibuprofen , and Heat. Sitting aggravates the pain.      Subjective: Sandy Waters is a 58 y.o. Pt presents for an OV with complaints of back pain worsening over last month. Her posterior neck is bothering her the most.  She states when she is sitting looking at the computer screen, pain will start in her neck and cause her to have headache.  Patient also suffers from vertigo.  She states her neck discomfort may have started around the same time as her vertigo.  She sometimes will start to have headaches, once her neck pain starts.  She has used the tramadol , ibuprofen  and heat application to help with discomfort.  These can help but take quite some time to kick in and she would prefer to try to avoid further neck pain if possible. She does have a prescription for baclofen  twice daily, which she states she does not take routinely. She has a significant history of lumbar disc degeneration.     02/13/2023    1:53 PM 12/06/2022    1:13 PM  Depression screen PHQ 2/9  Decreased Interest 1 0  Down, Depressed, Hopeless 0 0  PHQ - 2 Score 1 0  Altered sleeping  1  Tired, decreased energy  1  Change in appetite  1  Feeling bad or failure about yourself   0  Trouble concentrating  0  Moving slowly or fidgety/restless  0  Suicidal thoughts  0  PHQ-9 Score  3  Difficult doing work/chores  Not difficult at  all    Allergies  Allergen Reactions   Macrodantin [Nitrofurantoin Macrocrystal] Swelling    Face swells   Sulfa Antibiotics Swelling    microdantin   Latex Itching and Rash    Band aids   Social History   Social History Narrative   Not on file   Past Medical History:  Diagnosis Date   Acid reflux    Anxiety    Arthritis    Carpal tunnel syndrome, left upper limb 09/21/2017   She had a NCV with EMG performed on 04/06/17.  The study revealed a borderline to very mild median nerve entrapment at the left wrist.      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   Depression    Fibromyalgia    2012   Frequent headaches    Hiatal hernia 10/14/2010   History of cholecystectomy 07/10/2014   History of stomach ulcers 09/21/2017   Hypertension    IBS (irritable bowel syndrome) 2019   Migraines 2019   Non-alcoholic fatty liver disease 04/2017   Other insomnia 09/21/2017   Thyroid  disease    Past Surgical History:  Procedure Laterality Date   CESAREAN SECTION  1986   1986   CHOLECYSTECTOMY  2010   2010  COLONOSCOPY  2014   ?2014 AND REPORTEDLY NORMAL   COLONOSCOPY  05/2006   Dr. Morton D'Oro: few scattered ulcerations in lower rectum likely related to prep, toruous sigmoid colon difficult to pass, few diverticula in sigmoid colon.    ESOPHAGOGASTRODUODENOSCOPY  11/08/2013   Dr. Garlan: normal. biopsies from proximal and distal esophagus with no increased intraepithelial eosinophils, no intestinal metaplasia. gastric bx with chronic active gastritis, no H.pylori   ESOPHAGOGASTRODUODENOSCOPY  09/2010   Dr. Lauraine Denver: bile reflux, small hiatal hernia   ESOPHAGOGASTRODUODENOSCOPY  02/2005   Dr. Morton D'Oro: gastritis mostly in the antrum, superficial ulcerations in the stomach, mild duodenitis, hiatal hernia. gastric bx negative.   SHOULDER SURGERY Right 08/30/2016   bone spur/scar tissue removed    TONSILLECTOMY  1984   1984    Family History  Problem Relation Age of Onset   Heart disease Mother    Hypertension Mother    Diabetes Mother    Breast cancer Mother    Depression Father    Drug abuse Father    Early death Father    Prostate cancer Father        prostate    Hypertension Sister    Hypertension Brother    Schizophrenia Brother    Diabetes Brother    Heart disease Maternal Grandmother    Scoliosis Daughter    Colon cancer Neg Hx    Colon polyps Neg Hx    Allergies as of 03/03/2023       Reactions   Macrodantin [nitrofurantoin Macrocrystal] Swelling   Face swells   Sulfa Antibiotics Swelling   microdantin   Latex Itching, Rash   Band aids        Medication List        Accurate as of March 03, 2023  3:54 PM. If you have any questions, ask your nurse or doctor.          B-12 5000 MCG Caps Take by mouth.   baclofen  10 MG tablet Commonly known as: LIORESAL  Take 1 tablet (10 mg total) by mouth 2 (two) times daily.   buPROPion  300 MG 24 hr tablet Commonly known as: WELLBUTRIN  XL Take 1 tablet (300 mg total) by mouth daily.   busPIRone  15 MG tablet Commonly known as: BUSPAR  Take 1 tablet (15 mg total) by mouth as needed.   D3 PO Take by mouth.   magnesium oxide 400 (240 Mg) MG tablet Commonly known as: MAG-OX Take 400 mg by mouth daily.   meclizine 25 MG tablet Commonly known as: ANTIVERT Take 25 mg by mouth 2 (two) times daily as needed.   Mounjaro  2.5 MG/0.5ML Pen Generic drug: tirzepatide  Inject 2.5 mg into the skin once a week.   Mounjaro  5 MG/0.5ML Pen Generic drug: tirzepatide  Inject 5 mg into the skin once a week. Start taking on: March 13, 2023   ondansetron  4 MG tablet Commonly known as: ZOFRAN  Take 1 tablet (4 mg total) by mouth every 8 (eight) hours as needed for nausea or vomiting. Started by: Charlies Bellini   pantoprazole  40 MG tablet Commonly known as: PROTONIX  Take 1 tablet (40 mg total) by mouth daily.   rosuvastatin  5 MG  tablet Commonly known as: CRESTOR  Take 1 tablet (5 mg total) by mouth at bedtime.   traMADol  50 MG tablet Commonly known as: ULTRAM  Take 1 tablet (50 mg total) by mouth 3 (three) times daily as needed. For pain   valsartan  160 MG tablet Commonly known as: DIOVAN  Take 1  tablet (160 mg total) by mouth daily.        All past medical history, surgical history, allergies, family history, immunizations andmedications were updated in the EMR today and reviewed under the history and medication portions of their EMR.     ROS Negative, with the exception of above mentioned in HPI   Objective:  BP 104/74 (BP Location: Left Arm, Patient Position: Sitting, Cuff Size: Large)   Pulse 74   Temp 97.8 F (36.6 C) (Temporal)   Resp 18   Ht 5' 2 (1.575 m)   Wt 209 lb 9.6 oz (95.1 kg)   SpO2 97%   BMI 38.34 kg/m  Body mass index is 38.34 kg/m. Physical Exam Vitals and nursing note reviewed.  Constitutional:      General: She is not in acute distress.    Appearance: Normal appearance. She is normal weight. She is not ill-appearing or toxic-appearing.  HENT:     Head: Normocephalic and atraumatic.  Eyes:     General: No scleral icterus.       Right eye: No discharge.        Left eye: No discharge.     Extraocular Movements: Extraocular movements intact.     Conjunctiva/sclera: Conjunctivae normal.     Pupils: Pupils are equal, round, and reactive to light.  Musculoskeletal:        General: Normal range of motion.     Cervical back: Spasms and tenderness present. No swelling, erythema, rigidity, torticollis, bony tenderness or crepitus. Normal range of motion.       Back:  Skin:    Findings: No rash.  Neurological:     Mental Status: She is alert and oriented to person, place, and time. Mental status is at baseline.     Motor: No weakness.     Coordination: Coordination normal.     Gait: Gait normal.  Psychiatric:        Mood and Affect: Mood normal.        Behavior: Behavior  normal.        Thought Content: Thought content normal.        Judgment: Judgment normal.      No results found. No results found. No results found for this or any previous visit (from the past 24 hours).  Assessment/Plan: BRIASIA FLINDERS is a 58 y.o. female present for OV for  Neck pain (Primary)/muscle spasm Suspect discomfort is originating from muscle strain muscle spasms of the posterior neck and left trap.  She does have an elevated left first rib present. Encouraged her to start the baclofen  twice daily daily.  Currently she is only taking as needed and rarely taking. Will start with x-ray of her cervical spine to ensure appropriate alignment. - DG Cervical Spine Complete; Future Consider physical therapy versus referral to OMT therapy if appropriate after x-ray results received.  Reviewed lab results with patient from prior appointment today  Reviewed expectations re: course of current medical issues. Discussed self-management of symptoms. Outlined signs and symptoms indicating need for more acute intervention. Patient verbalized understanding and all questions were answered. Patient received an After-Visit Summary.    Orders Placed This Encounter  Procedures   DG Cervical Spine Complete   Meds ordered this encounter  Medications   ondansetron  (ZOFRAN ) 4 MG tablet    Sig: Take 1 tablet (4 mg total) by mouth every 8 (eight) hours as needed for nausea or vomiting.    Dispense:  20 tablet  Refill:  0   Referral Orders  No referral(s) requested today     Note is dictated utilizing voice recognition software. Although note has been proof read prior to signing, occasional typographical errors still can be missed. If any questions arise, please do not hesitate to call for verification.   electronically signed by:  Charlies Bellini, DO  [facility] - [place]

## 2023-03-03 NOTE — Patient Instructions (Signed)

## 2023-03-06 ENCOUNTER — Ambulatory Visit (HOSPITAL_BASED_OUTPATIENT_CLINIC_OR_DEPARTMENT_OTHER)
Admission: RE | Admit: 2023-03-06 | Discharge: 2023-03-06 | Disposition: A | Discharge status: Home / Self Care | Payer: Medicare HMO | Source: Ambulatory Visit | Attending: Family Medicine | Admitting: Family Medicine

## 2023-03-06 DIAGNOSIS — Z1231 Encounter for screening mammogram for malignant neoplasm of breast: Secondary | ICD-10-CM | POA: Insufficient documentation

## 2023-03-06 DIAGNOSIS — Z803 Family history of malignant neoplasm of breast: Secondary | ICD-10-CM | POA: Diagnosis not present

## 2023-03-07 ENCOUNTER — Telehealth: Payer: Self-pay | Admitting: Family Medicine

## 2023-03-07 DIAGNOSIS — M542 Cervicalgia: Secondary | ICD-10-CM

## 2023-03-07 DIAGNOSIS — M62838 Other muscle spasm: Secondary | ICD-10-CM

## 2023-03-07 NOTE — Telephone Encounter (Signed)
 Please inform patient the x-ray results of her neck are normal.  Alignment is good and the disc spaces are well-preserved between each vertebra.  I recall her saying she did not tolerate chiropractic care in the past.  Therefore instead of OMT like we discussed, we could refer her to physical therapy first and see how she responds with physical therapy and the baclofen  twice daily.  I have placed the physical therapy order at the [facility] location for her convenience.   Follow-up in 4-8 weeks, only if needing further evaluation.  If she is seeing improvement she does not need to follow-up.

## 2023-03-07 NOTE — Telephone Encounter (Signed)
 Spoke with patient regarding results/recommendations.

## 2023-03-13 NOTE — Therapy (Addendum)
 OUTPATIENT PHYSICAL THERAPY CERVICAL EVALUATION / DISCHARGE SUMMARY   Patient Name: Sandy Waters MRN: [id number] DOB:23-Feb-1966, 58 y.o., female Today's Date: 03/16/2023   END OF SESSION:  PT End of Session - 03/16/23 1530     Visit Number 1    Date for PT Re-Evaluation 05/11/23    Authorization Type [employer]    Authorization Time Period Authorization pending: 03/16/23 - 05/11/23    PT Start Time 1530    PT Stop Time 1620    PT Time Calculation (min) 50 min    Activity Tolerance Patient tolerated treatment well    Behavior During Therapy [facility] for tasks assessed/performed             Past Medical History:  Diagnosis Date   Acid reflux    Anxiety    Arthritis    Carpal tunnel syndrome, left upper limb 09/21/2017   She had a NCV with EMG performed on 04/06/17.  The study revealed a borderline to very mild median nerve entrapment at the left wrist.      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   Depression    Fibromyalgia    2012   Frequent headaches    Hiatal hernia 10/14/2010   History of cholecystectomy 07/10/2014   History of stomach ulcers 09/21/2017   Hypertension    IBS (irritable bowel syndrome) 2019   Migraines 2019   Non-alcoholic fatty liver disease 04/2017   Other insomnia 09/21/2017   Thyroid  disease    Past Surgical History:  Procedure Laterality Date   CESAREAN SECTION  1986   1986   CHOLECYSTECTOMY  2010   2010   COLONOSCOPY  2014   ?2014 AND REPORTEDLY NORMAL   COLONOSCOPY  05/2006   Dr. Lars Poche D'Oro: few scattered ulcerations in lower rectum likely related to prep, toruous sigmoid colon difficult to pass, few diverticula in sigmoid colon.    ESOPHAGOGASTRODUODENOSCOPY  11/08/2013   Dr. Mariea Shook: normal. biopsies from proximal and distal esophagus with no increased intraepithelial eosinophils, no intestinal metaplasia. gastric bx with chronic active gastritis, no H.pylori    ESOPHAGOGASTRODUODENOSCOPY  09/2010   Dr. Alease Hunter: bile reflux, small hiatal hernia   ESOPHAGOGASTRODUODENOSCOPY  02/2005   Dr. Lars Poche D'Oro: gastritis mostly in the antrum, superficial ulcerations in the stomach, mild duodenitis, hiatal hernia. gastric bx negative.   SHOULDER SURGERY Right 08/30/2016   bone spur/scar tissue removed    TONSILLECTOMY  1984   1984   Patient Active Problem List   Diagnosis Date Noted   Morbid obesity (HCC) 02/14/2023   Primary hypertension 12/06/2022   GERD (gastroesophageal reflux disease) 10/25/2017   Non-alcoholic fatty liver disease 10/25/2017   Fibromyalgia 09/21/2017   Primary osteoarthritis of both knees 09/21/2017   DDD (degenerative disc disease), lumbar 09/21/2017   Anxiety and depression 09/21/2017   Hyperlipidemia 09/21/2017   Type 2 diabetes mellitus with hyperlipidemia (HCC) 06/02/2015   IBS (irritable bowel syndrome) 10/14/2011   Vitamin D  deficiency 06/08/2009    PCP: Mariel Shope, DO   REFERRING PROVIDER: Mariel Shope, DO   REFERRING DIAG:  M54.2 (ICD-10-CM) - Neck pain  M62.838 (ICD-10-CM) - Muscle spasm   THERAPY DIAG:  Cervicalgia  Radiculopathy, cervical region  Other muscle spasm  Abnormal posture  Muscle weakness (generalized)  RATIONALE FOR EVALUATION AND TREATMENT: Rehabilitation  ONSET DATE: >1 month  NEXT MD VISIT: 05/23/23   SUBJECTIVE:  SUBJECTIVE STATEMENT: Pt reports worsening pain in upper neck over the last 1+ month, esp when looking down. She also notes pressure in the occiput as well as positionally related dizziness in end ROM cervical flexion and extension as well with changes of body position.  She has previously been diagnosed with vertigo.  She reports headaches at least 2x/wk that seem to  "come and go" in cycles.  She notes radicular pain into upper back and shoulder blades, mostly on the L.  Hand dominance: Right  PAIN: Are you having pain? Yes: NPRS scale: 2/10 currently; up to 7/10 at worst Pain location: L>R upper neck Pain description: discomfort, pressure in occiput Aggravating factors: looking down, leaning head back against something Relieving factors: Tylenol, heating pad  PERTINENT HISTORY:  Arthritis - B knees, L carpal tunnel, chronic LBP/R SI joint pain, lumbar DDD, fibromyalgia, migraines, anxiety, depression, DM-II, vertigo  PRECAUTIONS: None  RED FLAGS: Cervical red flags: blurry vision at times  HAND DOMINANCE: Right  WEIGHT BEARING RESTRICTIONS: No  FALLS:  Has patient fallen in last 6 months? No  LIVING ENVIRONMENT: Lives with: lives alone Lives in: House/apartment Stairs: Yes: Internal: 14 steps; on right going up - lives on on main level with bed & bath Has following equipment at home: None  OCCUPATION: On disability due to fibromyalgia and LBP  PLOF: Independent and Leisure: mostly sedentary, stationary bike x ~20 min a few times/wk since start of year, vibration plate  PATIENT GOALS: "Pain free and dizziness/pressure to go away."   OBJECTIVE: (objective measures completed at initial evaluation unless otherwise dated)  DIAGNOSTIC FINDINGS:  03/03/23 - DG Cervical spine: IMPRESSION: 1. No acute fracture or traumatic listhesis of the cervical spine. 2. Calcification in the region of the RIGHT submandibular gland, possibly a sialolith. This is similar compared to prior.  03/12/18 - XR Cervical spine: No significant disc space narrowing was noted.  Mild facet joint  arthropathy was noted.   PATIENT SURVEYS:  NDI 21 / 50 = 42.0 %  COGNITION: Overall cognitive status: Within functional limits for tasks assessed  SENSATION: WFL  POSTURE:  rounded shoulders, forward head, increased thoracic kyphosis, and L shoulder slightly  elevated relative to R with L scapular winging evident  PALPATION: Increased muscle tension and TTP in suboccipitals, cervical paraspinals, UT and LS (L>R)  CERVICAL ROM:   Active ROM Eval  Flexion 31  Extension 30 *  Right lateral flexion 48  Left lateral flexion 39  Right rotation 68  Left rotation 65 tight   (Blank rows = not tested, * - dizzy)  UPPER EXTREMITY ROM: B shoulder ROM grossly WFL other than mildly limited B FIR (symmetrical)  UPPER EXTREMITY MMT:  MMT Right eval Left eval  Shoulder flexion 4 4  Shoulder extension 4 4  Shoulder abduction 4 4  Shoulder adduction    Shoulder internal rotation 4- 4-  Shoulder external rotation 4- 4-  Middle trapezius 3+ 3+  Lower trapezius 3- 3-  Elbow flexion    Elbow extension    Wrist flexion    Wrist extension    Wrist ulnar deviation    Wrist radial deviation    Wrist pronation    Wrist supination    Grip strength     (Blank rows = not tested)  CERVICAL SPECIAL TESTS:  Spurling's test: Negative, Distraction test: Negative, and Vertebral artery test: Negative   TODAY'S TREATMENT:   03/16/2023 - Eval THERAPEUTIC EXERCISE: to improve flexibility, strength and mobility.  Demonstration,  verbal and tactile cues throughout for technique.  Supine cervical retraction into pillow- pt having difficulty isolating desired movement Seated cervical retraction 10 x 5" Seated gentle L UT stretch x 30" Seated gentle L LS stretch x 30" Seated scapular retraction 10 x 5"   PATIENT EDUCATION:  Education details: PT eval findings, anticipated POC, initial HEP, and role of DN Person educated: Patient Education method: Explanation, Demonstration, Tactile cues, Verbal cues, and Handouts Education comprehension: verbalized understanding, returned demonstration, verbal cues required, tactile cues required, and needs further education  HOME EXERCISE PROGRAM: Access Code: [email or web address] URL: [email or web address] Date:  03/16/2023 Prepared by: Felecia Hopper  Exercises - Seated Cervical Retraction  - 2 x daily - 7 x weekly - 2 sets - 10 reps - 3-5 sec hold - Seated Gentle Upper Trapezius Stretch (Mirrored)  - 2 x daily - 7 x weekly - 3 reps - 30 sec hold - Gentle Levator Scapulae Stretch  - 2 x daily - 7 x weekly - 3 reps - 30 sec hold - Seated Scapular Retraction  - 2 x daily - 7 x weekly - 2 sets - 10 reps - 5 sec hold   ASSESSMENT:  CLINICAL IMPRESSION: DEANDREA LANDGREN is a 58 y.o. female who was referred to physical therapy for evaluation and treatment for neck pain and muscle spasm.  Patient reports onset of neck and upper back/upper shoulder pain beginning a little more than a month ago without known MOI.  Pain is worse with looking down or pressure on back of head.  She also notes moderate headaches at least 2x/wk, as well as intermittent dizziness/vertigo which may be cervicogenic in origin although patient does have a history of vertigo.  Pending response to initial therapy, patient may benefit from further vestibular evaluation if dizziness/vertigo continues to be an issue.  Patient has deficits in cervical ROM, B shoulder and scapular/postural muscle strength, abnormal posture including forward head,  rounded shoulders with left shoulder elevated relative to right and left scapular winging, and TTP with abnormal muscle tension which are interfering with ADLs and are impacting quality of life.  On NDI patient scored 21/50 demonstrating 42% or moderate disability.  Audris will benefit from skilled PT to address above deficits to improve mobility and activity tolerance with decreased pain interference.  OBJECTIVE IMPAIRMENTS: decreased activity tolerance, decreased knowledge of condition, decreased ROM, decreased strength, dizziness, hypomobility, increased fascial restrictions, impaired perceived functional ability, increased muscle spasms, impaired flexibility, impaired UE functional use, improper body  mechanics, postural dysfunction, and pain.   ACTIVITY LIMITATIONS: carrying, lifting, sitting, sleeping, and hygiene/grooming  PARTICIPATION LIMITATIONS: meal prep, cleaning, laundry, driving, and community activity  PERSONAL FACTORS: Fitness, Past/current experiences, Time since onset of injury/illness/exacerbation, and 3+ comorbidities: Arthritis - B knees, L carpal tunnel, chronic LBP/R SI joint pain, lumbar DDD, fibromyalgia, migraines, anxiety, depression, DM-II, vertigo  are also affecting patient's functional outcome.   REHAB POTENTIAL: Good  CLINICAL DECISION MAKING: Evolving/moderate complexity  EVALUATION COMPLEXITY: Moderate   GOALS: Goals reviewed with patient? Yes  SHORT TERM GOALS: Target date: 04/13/2023  Patient will be independent with initial HEP to improve outcomes and carryover.  Baseline:  Goal status: INITIAL  2.  Patient will report 25% improvement in neck pain to improve QOL.  Baseline: 2/10 at time of eval; up to 7/10 at worst Goal status: INITIAL  LONG TERM GOALS: Target date: 05/11/2023  Patient will be independent with ongoing/advanced HEP for self-management at home.  Baseline:  Goal status: INITIAL  2.  Patient will demonstrate improved posture to decrease muscle imbalance. Baseline: Forward head and rounded shoulder posture with L shoulder elevated relative to R and L scapular winging Goal status: INITIAL  3.  Patient will report 50-75% improvement in neck pain to improve QOL.  Baseline: 2/10 at time of eval; up to 7/10 at worst Goal status: INITIAL  4.  Patient to report 50-75% reduction in frequency and intensity of weekly headaches/migraines.   Baseline: Moderate headaches at least 2 times a week Goal status: INITIAL   5.  Patient will demonstrate full pain free cervical ROM for safety with driving.  Baseline: Refer to above cervical ROM table Goal status: INITIAL  6.  Patient will report </= 27% on NDI (MCID = 15%) to demonstrate  improved functional ability.  Baseline: 21 / 50 = 42.0 % Goal status: INITIAL  7.  Patient will report reduction in sleep disturbance due to neck pain. Baseline: Moderate sleep disturbance due to pain Goal status: INITIAL    PLAN:  PT FREQUENCY: 1-2x/week  PT DURATION: 8 weeks  PLANNED INTERVENTIONS: 97164- PT Re-evaluation, 97110-Therapeutic exercises, 97530- Therapeutic activity, 97112- Neuromuscular re-education, 97535- Self Care, 16109- Manual therapy, [phone]- Canalith repositioning, 97014- Electrical stimulation (unattended), 97035- Ultrasound, 09811- Traction (mechanical), Patient/Family education, Taping, Dry Needling, Joint mobilization, Spinal mobilization, Vestibular training, Visual/preceptual remediation/compensation, Cryotherapy, and Moist heat  PLAN FOR NEXT SESSION: Review initial HEP; progress postural stretching and strengthening with emphasis on scapular engagement; MT +/- TPDN to cervical paraspinals, suboccipitals, UT and LS as indicated; modalities as needed for pain management   Francisco Irving, PT 03/16/2023, 7:57 PM   PHYSICAL THERAPY DISCHARGE SUMMARY  Visits from Start of Care: 1  Current functional level related to goals / functional outcomes: Refer to above clinical impression and goal assessment for status as of eval visit on 03/16/2023. Patient cancelled all f/u visits stating she was unable to afford PT, therefore will proceed with discharge from PT for this episode.     Remaining deficits: As above.   Education / Equipment: Initial HEP   Patient agrees to discharge. Patient goals were not met. Patient is being discharged due to financial reasons.  Francisco Irving, PT 06/29/2023, 10:56 AM  [facility] [address]  [address] [place], [place], [address] Phone: [phone]   Fax:  [phone]

## 2023-03-16 ENCOUNTER — Other Ambulatory Visit: Payer: Self-pay

## 2023-03-16 ENCOUNTER — Encounter: Payer: Self-pay | Admitting: Physical Therapy

## 2023-03-16 ENCOUNTER — Ambulatory Visit: Payer: Medicare HMO | Attending: Family Medicine | Admitting: Physical Therapy

## 2023-03-16 DIAGNOSIS — M6281 Muscle weakness (generalized): Secondary | ICD-10-CM | POA: Insufficient documentation

## 2023-03-16 DIAGNOSIS — R293 Abnormal posture: Secondary | ICD-10-CM | POA: Diagnosis not present

## 2023-03-16 DIAGNOSIS — M5412 Radiculopathy, cervical region: Secondary | ICD-10-CM | POA: Insufficient documentation

## 2023-03-16 DIAGNOSIS — M542 Cervicalgia: Secondary | ICD-10-CM | POA: Diagnosis not present

## 2023-03-16 DIAGNOSIS — M62838 Other muscle spasm: Secondary | ICD-10-CM | POA: Insufficient documentation

## 2023-03-30 ENCOUNTER — Encounter: Payer: Medicare HMO | Admitting: Physical Therapy

## 2023-03-30 ENCOUNTER — Other Ambulatory Visit: Payer: Self-pay | Admitting: Family Medicine

## 2023-03-30 NOTE — Telephone Encounter (Signed)
Copied from CRM [id number]. Topic: Clinical - Medication Refill >> Mar 30, 2023 10:28 AM Myrtice Lauth wrote: Most Recent Primary Care Visit:  Provider: Felix Pacini A  Department: [facility]  Visit Type: PHYSICAL  Date: 02/13/2023  Medication: MOUNJARO 5 MG/0.5ML Pen  Has the patient contacted their pharmacy? Yes (Agent: If no, request that the patient contact the pharmacy for the refill. If patient does not wish to contact the pharmacy document the reason why and proceed with request.) (Agent: If yes, when and what did the pharmacy advise?)  Is this the correct pharmacy for this prescription? Yes If no, delete pharmacy and type the correct one.  This is the patient's preferred pharmacy:  [id number], [place] - [address] [place] [address] Phone: [phone] Fax: [phone]   Has the prescription been filled recently? Yes  Is the patient out of the medication? Yes  Has the patient been seen for an appointment in the last year OR does the patient have an upcoming appointment? Yes  Can we respond through MyChart? Yes  Agent: Please be advised that Rx refills may take up to 3 business days. We ask that you follow-up with your pharmacy.

## 2023-04-06 ENCOUNTER — Encounter: Payer: Medicare HMO | Admitting: Physical Therapy

## 2023-04-13 ENCOUNTER — Encounter: Payer: Medicare HMO | Admitting: Physical Therapy

## 2023-04-20 ENCOUNTER — Encounter: Payer: Medicare HMO | Admitting: Physical Therapy

## 2023-04-26 ENCOUNTER — Other Ambulatory Visit: Payer: Self-pay | Admitting: Family Medicine

## 2023-04-26 NOTE — Telephone Encounter (Signed)
 Copied from CRM [id number]. Topic: Clinical - Medication Refill >> Apr 26, 2023  1:12 PM Sonny Dandy B wrote: Most Recent Primary Care Visit:  Provider: Felix Pacini A  Department: [facility]  Visit Type: PHYSICAL  Date: 02/13/2023  Medication: MOUNJARO 5 MG/0.5ML Pen  Has the patient contacted their pharmacy? Yes (Agent: If no, request that the patient contact the pharmacy for the refill. If patient does not wish to contact the pharmacy document the reason why and proceed with request.) (Agent: If yes, when and what did the pharmacy advise?)  Is this the correct pharmacy for this prescription? Yes If no, delete pharmacy and type the correct one.  This is the patient's preferred pharmacy:  [id number] [place], [place] - [address] [place] [address] Phone: [phone] Fax: [phone]   Has the prescription been filled recently? Yes  Is the patient out of the medication? Yes  Has the patient been seen for an appointment in the last year OR does the patient have an upcoming appointment? Yes  Can we respond through MyChart? Yes  Agent: Please be advised that Rx refills may take up to 3 business days. We ask that you follow-up with your pharmacy.

## 2023-04-28 MED ORDER — MOUNJARO 5 MG/0.5ML ~~LOC~~ SOAJ: 5.0000 mg | SUBCUTANEOUS | 4 refills | Status: DC

## 2023-05-23 ENCOUNTER — Encounter: Payer: Self-pay | Admitting: Family Medicine

## 2023-05-23 ENCOUNTER — Ambulatory Visit: Payer: Medicare HMO | Admitting: Family Medicine

## 2023-05-23 VITALS — BP 132/80 | HR 78 | Temp 98.0°F | Wt 190.4 lb

## 2023-05-23 DIAGNOSIS — M62838 Other muscle spasm: Secondary | ICD-10-CM

## 2023-05-23 DIAGNOSIS — K219 Gastro-esophageal reflux disease without esophagitis: Secondary | ICD-10-CM

## 2023-05-23 DIAGNOSIS — Z7985 Long-term (current) use of injectable non-insulin antidiabetic drugs: Secondary | ICD-10-CM

## 2023-05-23 DIAGNOSIS — E559 Vitamin D deficiency, unspecified: Secondary | ICD-10-CM | POA: Diagnosis not present

## 2023-05-23 DIAGNOSIS — Z79899 Other long term (current) drug therapy: Secondary | ICD-10-CM

## 2023-05-23 DIAGNOSIS — I10 Essential (primary) hypertension: Secondary | ICD-10-CM

## 2023-05-23 DIAGNOSIS — M797 Fibromyalgia: Secondary | ICD-10-CM

## 2023-05-23 DIAGNOSIS — Z6834 Body mass index (BMI) 34.0-34.9, adult: Secondary | ICD-10-CM | POA: Diagnosis not present

## 2023-05-23 DIAGNOSIS — M170 Bilateral primary osteoarthritis of knee: Secondary | ICD-10-CM

## 2023-05-23 DIAGNOSIS — Z23 Encounter for immunization: Secondary | ICD-10-CM | POA: Diagnosis not present

## 2023-05-23 DIAGNOSIS — E785 Hyperlipidemia, unspecified: Secondary | ICD-10-CM | POA: Diagnosis not present

## 2023-05-23 DIAGNOSIS — M542 Cervicalgia: Secondary | ICD-10-CM

## 2023-05-23 DIAGNOSIS — F419 Anxiety disorder, unspecified: Secondary | ICD-10-CM

## 2023-05-23 DIAGNOSIS — F32A Depression, unspecified: Secondary | ICD-10-CM

## 2023-05-23 DIAGNOSIS — E782 Mixed hyperlipidemia: Secondary | ICD-10-CM | POA: Diagnosis not present

## 2023-05-23 DIAGNOSIS — K760 Fatty (change of) liver, not elsewhere classified: Secondary | ICD-10-CM

## 2023-05-23 DIAGNOSIS — M51362 Other intervertebral disc degeneration, lumbar region with discogenic back pain and lower extremity pain: Secondary | ICD-10-CM

## 2023-05-23 DIAGNOSIS — Z5181 Encounter for therapeutic drug level monitoring: Secondary | ICD-10-CM

## 2023-05-23 DIAGNOSIS — E1169 Type 2 diabetes mellitus with other specified complication: Secondary | ICD-10-CM

## 2023-05-23 LAB — COMPREHENSIVE METABOLIC PANEL
ALT: 35 U/L (ref 0–35)
AST: 27 U/L (ref 0–37)
Albumin: 4.5 g/dL (ref 3.5–5.2)
Alkaline Phosphatase: 66 U/L (ref 39–117)
BUN: 8 mg/dL (ref 6–23)
CO2: 28 meq/L (ref 19–32)
Calcium: 9.6 mg/dL (ref 8.4–10.5)
Chloride: 105 meq/L (ref 96–112)
Creatinine, Ser: 1 mg/dL (ref 0.40–1.20)
GFR: 61.53 mL/min (ref >60.00)
Glucose, Bld: 93 mg/dL (ref 70–99)
Potassium: 3.7 meq/L (ref 3.5–5.1)
Sodium: 142 meq/L (ref 135–145)
Total Bilirubin: 0.5 mg/dL (ref 0.2–1.2)
Total Protein: 7.6 g/dL (ref 6.0–8.3)

## 2023-05-23 LAB — POCT GLYCOSYLATED HEMOGLOBIN (HGB A1C)
HbA1c POC (<> result, manual entry): 5.3 % (ref 4.0–5.6)
HbA1c, POC (controlled diabetic range): 5.3 % (ref 0.0–7.0)
HbA1c, POC (prediabetic range): 5.3 % — AB (ref 5.7–6.4)
Hemoglobin A1C: 5.3 % (ref 4.0–5.6)

## 2023-05-23 LAB — B12 AND FOLATE PANEL
Folate: 14 ng/mL (ref >5.9)
Vitamin B-12: 492 pg/mL (ref 211–911)

## 2023-05-23 LAB — MICROALBUMIN / CREATININE URINE RATIO
Creatinine,U: 42.8 mg/dL
Microalb Creat Ratio: UNDETERMINED mg/g (ref 0.0–30.0)
Microalb, Ur: -0.1 mg/dL — ABNORMAL LOW (ref 0.0–1.9)

## 2023-05-23 LAB — MAGNESIUM: Magnesium: 2 mg/dL (ref 1.5–2.5)

## 2023-05-23 MED ORDER — BUPROPION HCL ER (XL) 300 MG PO TB24: 300.0000 mg | ORAL_TABLET | Freq: Every day | ORAL | 1 refills | Status: DC

## 2023-05-23 MED ORDER — VALSARTAN 160 MG PO TABS: 160.0000 mg | ORAL_TABLET | Freq: Every day | ORAL | 1 refills | Status: DC

## 2023-05-23 MED ORDER — PANTOPRAZOLE SODIUM 40 MG PO TBEC: 40.0000 mg | DELAYED_RELEASE_TABLET | Freq: Every day | ORAL | 1 refills | Status: DC

## 2023-05-23 MED ORDER — ONDANSETRON HCL 4 MG PO TABS: 4.0000 mg | ORAL_TABLET | Freq: Three times a day (TID) | ORAL | 0 refills | Status: DC | PRN

## 2023-05-23 MED ORDER — MOUNJARO 5 MG/0.5ML ~~LOC~~ SOAJ: 5.0000 mg | SUBCUTANEOUS | 4 refills | Status: DC

## 2023-05-23 MED ORDER — TRAMADOL HCL 50 MG PO TABS: 50.0000 mg | ORAL_TABLET | Freq: Three times a day (TID) | ORAL | 5 refills | Status: DC | PRN

## 2023-05-23 MED ORDER — ATORVASTATIN CALCIUM 20 MG PO TABS
20.0000 mg | ORAL_TABLET | Freq: Every day | ORAL | 3 refills | Status: AC
Start: 2023-05-23 — End: ?

## 2023-05-23 NOTE — Progress Notes (Signed)
 Patient ID: Sandy Waters, female  DOB: 10/21/1965, 58 y.o.   MRN: [id number] Patient Care Team    Relationship Specialty Notifications Start End  Natalia Leatherwood, DO PCP - General Family Medicine  12/06/22   West Bali, MD (Inactive) Consulting Physician Gastroenterology  05/17/17   Silas Flood, MD Referring Physician Obstetrics and Gynecology  12/06/22   Ander Purpura, OD  Optometry  12/06/22   Tiffany Kocher, PA-C Physician Assistant Gastroenterology  12/06/22    Comment: [facility]    Chief Complaint  Patient presents with   Hypertension    Subjective: Sandy Waters is a 58 y.o.  female present for chronic condition management appointment All past medical history, surgical history, allergies, family history, immunizations, medications and social history were updated in the electronic medical record today. All recent labs, ED visits and hospitalizations within the last year were reviewed.  Primary hypertension Pt reports compliance with Diovan 160 mg. Blood pressures ranges at home. Patient denies chest pain, shortness of breath, dizziness or lower extremity edema.  Pt is  prescribed statin.   Anxiety and depression Patient reports compliance Wellbutrin 300 mg daily.  She has been on this medication for some time.  She uses BuSpar only when needed.  Fibromyalgia/Degeneration of intervertebral disc of lumbar region with discogenic back pain and lower extremity pain Patient reports she has been diagnosed with fibromyalgia around 2012.   She also experiences low back pain from her lumbar degenerative disc disease.  She uses baclofen when needed and tramadol approximately 1-2 times daily, 3 times daily with a flare. Recent flare in her neck-improved, but she is wondering if physical therapy or chiropractic care could help resolve issue.  And help prevent recurrence.  Gastroesophageal reflux disease without esophagitis Patient reports she compliance with  Protonix 40 mg daily.  Diabetes type 2 Pt reports compliance with Mounjaro and is tolerating taper.  Denies numbness, tingling of extremities, hypo/hyperglycemic events or non-healing wounds.      05/23/2023    1:07 PM 02/13/2023    1:53 PM 12/06/2022    1:13 PM  Depression screen PHQ 2/9  Decreased Interest 0 1 0  Down, Depressed, Hopeless 0 0 0  PHQ - 2 Score 0 1 0  Altered sleeping 0  1  Tired, decreased energy 0  1  Change in appetite 0  1  Feeling bad or failure about yourself  0  0  Trouble concentrating 0  0  Moving slowly or fidgety/restless 0  0  Suicidal thoughts 0  0  PHQ-9 Score 0  3  Difficult doing work/chores Not difficult at all  Not difficult at all      05/23/2023    1:07 PM 12/06/2022    1:13 PM  GAD 7 : Generalized Anxiety Score  Nervous, Anxious, on Edge 0 1  Control/stop worrying 0 0  Worry too much - different things 0 0  Trouble relaxing 0 0  Restless 0 0  Easily annoyed or irritable 0 0  Afraid - awful might happen 0 0  Total GAD 7 Score 0 1  Anxiety Difficulty Not difficult at all Not difficult at all           05/23/2023    1:07 PM 02/13/2023    1:53 PM 12/06/2022    1:13 PM  Fall Risk   Falls in the past year? 0 0 0  Number falls in past yr:  0 0  Injury  with Fall?  0 0  Risk for fall due to :  No Fall Risks No Fall Risks  Follow up Falls evaluation completed Falls evaluation completed Falls evaluation completed    Immunization History  Administered Date(s) Administered   Influenza, Seasonal, Injecte, Preservative Fre 01/03/2013   Influenza-Unspecified 12/18/2007   PNEUMOCOCCAL CONJUGATE-20 05/23/2023   Tdap 07/05/2011    No results found.  Past Medical History:  Diagnosis Date   Acid reflux    Anxiety    Arthritis    Carpal tunnel syndrome, left upper limb 09/21/2017   She had a NCV with EMG performed on 04/06/17.  The study revealed a borderline to very mild median nerve entrapment at the left wrist.      Cellulitis  10/29/2009   Chronic low back pain    Chronic right SI joint pain 09/21/2017   Depression    Fibromyalgia    2012   Frequent headaches    Hiatal hernia 10/14/2010   History of cholecystectomy 07/10/2014   History of stomach ulcers 09/21/2017   Hypertension    IBS (irritable bowel syndrome) 2019   Migraines 2019   Non-alcoholic fatty liver disease 04/2017   Other insomnia 09/21/2017   Thyroid disease    Allergies  Allergen Reactions   Macrodantin [Nitrofurantoin Macrocrystal] Swelling    Face swells   Sulfa Antibiotics Swelling    microdantin   Latex Itching and Rash    Band aids   Past Surgical History:  Procedure Laterality Date   CESAREAN SECTION  1986   1986   CHOLECYSTECTOMY  2010   2010   COLONOSCOPY  2014   ?2014 AND REPORTEDLY NORMAL   COLONOSCOPY  05/2006   Dr. Lissa Hoard D'Oro: few scattered ulcerations in lower rectum likely related to prep, toruous sigmoid colon difficult to pass, few diverticula in sigmoid colon.    ESOPHAGOGASTRODUODENOSCOPY  11/08/2013   Dr. Amado Coe: normal. biopsies from proximal and distal esophagus with no increased intraepithelial eosinophils, no intestinal metaplasia. gastric bx with chronic active gastritis, no H.pylori   ESOPHAGOGASTRODUODENOSCOPY  09/2010   Dr. Joycelyn Schmid: bile reflux, small hiatal hernia   ESOPHAGOGASTRODUODENOSCOPY  02/2005   Dr. Lissa Hoard D'Oro: gastritis mostly in the antrum, superficial ulcerations in the stomach, mild duodenitis, hiatal hernia. gastric bx negative.   SHOULDER SURGERY Right 08/30/2016   bone spur/scar tissue removed    TONSILLECTOMY  1984   1984   Family History  Problem Relation Age of Onset   Heart disease Mother    Hypertension Mother    Diabetes Mother    Breast cancer Mother    Depression Father    Drug abuse Father    Early death Father    Prostate cancer Father        prostate    Hypertension Sister    Hypertension Brother    Schizophrenia Brother    Diabetes Brother    Heart  disease Maternal Grandmother    Scoliosis Daughter    Colon cancer Neg Hx    Colon polyps Neg Hx    Social History   Social History Narrative   Not on file    Allergies as of 05/23/2023       Reactions   Macrodantin [nitrofurantoin Macrocrystal] Swelling   Face swells   Sulfa Antibiotics Swelling   microdantin   Latex Itching, Rash   Band aids        Medication List        Accurate as of May 23, 2023  4:14 PM. If you have any questions, ask your nurse or doctor.          STOP taking these medications    amLODipine 5 MG tablet Commonly known as: NORVASC Stopped by: Felix Pacini       TAKE these medications    atorvastatin 20 MG tablet Commonly known as: LIPITOR Take 1 tablet (20 mg total) by mouth at bedtime. Started by: Felix Pacini   B-12 5000 MCG Caps Take by mouth.   baclofen 10 MG tablet Commonly known as: LIORESAL Take 1 tablet (10 mg total) by mouth 2 (two) times daily.   buPROPion 300 MG 24 hr tablet Commonly known as: WELLBUTRIN XL Take 1 tablet (300 mg total) by mouth daily.   busPIRone 15 MG tablet Commonly known as: BUSPAR Take 1 tablet (15 mg total) by mouth as needed.   D3 PO Take by mouth.   magnesium oxide 400 (240 Mg) MG tablet Commonly known as: MAG-OX Take 400 mg by mouth daily.   meclizine 25 MG tablet Commonly known as: ANTIVERT Take 25 mg by mouth 2 (two) times daily as needed.   Mounjaro 5 MG/0.5ML Pen Generic drug: tirzepatide Inject 5 mg into the skin once a week. What changed: Another medication with the same name was removed. Continue taking this medication, and follow the directions you see here. Changed by: Felix Pacini   ondansetron 4 MG tablet Commonly known as: ZOFRAN Take 1 tablet (4 mg total) by mouth every 8 (eight) hours as needed for nausea or vomiting.   pantoprazole 40 MG tablet Commonly known as: PROTONIX Take 1 tablet (40 mg total) by mouth daily.   rosuvastatin 5 MG tablet Commonly  known as: CRESTOR Take 1 tablet (5 mg total) by mouth at bedtime.   traMADol 50 MG tablet Commonly known as: ULTRAM Take 1 tablet (50 mg total) by mouth 3 (three) times daily as needed. For pain   valsartan 160 MG tablet Commonly known as: DIOVAN Take 1 tablet (160 mg total) by mouth daily.        All past medical history, surgical history, allergies, family history, immunizations andmedications were updated in the EMR today and reviewed under the history and medication portions of their EMR.      No results found.   ROS 14 pt review of systems performed and negative (unless mentioned in an HPI)  Objective: BP 132/80   Pulse 78   Temp 98 F (36.7 C)   Wt 190 lb 6.4 oz (86.4 kg)   SpO2 98%   BMI 34.82 kg/m  Physical Exam Vitals and nursing note reviewed.  Constitutional:      General: She is not in acute distress.    Appearance: Normal appearance. She is not ill-appearing, toxic-appearing or diaphoretic.  HENT:     Head: Normocephalic and atraumatic.  Eyes:     General: No scleral icterus.       Right eye: No discharge.        Left eye: No discharge.     Extraocular Movements: Extraocular movements intact.     Conjunctiva/sclera: Conjunctivae normal.     Pupils: Pupils are equal, round, and reactive to light.  Cardiovascular:     Rate and Rhythm: Normal rate and regular rhythm.     Heart sounds: No murmur heard. Pulmonary:     Effort: Pulmonary effort is normal. No respiratory distress.     Breath sounds: Normal breath sounds. No wheezing, rhonchi or rales.  Musculoskeletal:  Right lower leg: No edema.     Left lower leg: No edema.  Skin:    General: Skin is warm.     Findings: No rash.  Neurological:     Mental Status: She is alert and oriented to person, place, and time. Mental status is at baseline.     Motor: No weakness.     Gait: Gait normal.  Psychiatric:        Mood and Affect: Mood normal.        Behavior: Behavior normal.        Thought  Content: Thought content normal.        Judgment: Judgment normal.     Diabetic Foot Exam - Simple   Simple Foot Form Diabetic Foot exam was performed with the following findings: Yes 05/23/2023  1:33 PM  Visual Inspection No deformities, no ulcerations, no other skin breakdown bilaterally: Yes Sensation Testing Pulse Check Posterior Tibialis and Dorsalis pulse intact bilaterally: Yes Comments    Results for orders placed or performed in visit on 05/23/23 (from the past 48 hours)  POCT HgB A1C     Status: Abnormal   Collection Time: 05/23/23  1:10 PM  Result Value Ref Range   Hemoglobin A1C 5.3 4.0 - 5.6 %   HbA1c POC (<> result, manual entry) 5.3 4.0 - 5.6 %   HbA1c, POC (prediabetic range) 5.3 (A) 5.7 - 6.4 %   HbA1c, POC (controlled diabetic range) 5.3 0.0 - 7.0 %     Assessment/plan: Sandy Waters is a 58 y.o. female present for  chronic condition management Primary hypertension/hyperlipidemia/family history of heart disease Stable Continue Diovan 160 mg daily. -Routine exercise - Low-sodium diet Labs due next visit  Anxiety and depression Stable Continue  Wellbutrin 300 mg daily Continue BuSpar 15 mg daily as needed  Fibromyalgia/Degeneration of intervertebral disc of lumbar region with discogenic back pain and lower extremity pain Stable Continue baclofen 10 mg twice daily as needed Continue  tramadol 50 mg 2-3 times daily as needed [place] controlled substance database reviewed and appropriate 05/23/23  Neck pain/neck spasms rash elevated first rib Continue muscle relaxants as needed, refer to sports med for OMT evaluate and treat  Gastroesophageal reflux disease without esophagitis Stable Continue  Protonix 40 mg daily. Monitor B12, vitamin D (UTD 01/2023) and mag every 2-3 years. Mag and b12 collected    Diabetes (new onset) doing great! Lost 27 lbs Continue mounjaro 5 mg weekly injection- could consider increase next visit.  - Hemoglobin  A1c 6.6 > 5.3 today PNA series: completed today 05/23/2023 Flu shot: Declined (recommneded yearly) GFR-UTD 01/2023 Foot exam: Completed today 05/23/2023 Eye exam: Discussed yearly ophthalmology exams for diabetes-[facility] Microalbumin collected today 05/23/2023  Return in about 14 weeks (around 08/29/2023) for Routine chronic condition follow-up.  Orders Placed This Encounter  Procedures   Pneumococcal conjugate vaccine 20-valent   Urine Albumin/Creatinine with ratio (send out) [LAB689]   Magnesium   B12 and Folate Panel   Comp Met (CMET)   Ambulatory referral to Sports Medicine   POCT HgB A1C   Meds ordered this encounter  Medications   valsartan (DIOVAN) 160 MG tablet    Sig: Take 1 tablet (160 mg total) by mouth daily.    Dispense:  90 tablet    Refill:  1   pantoprazole (PROTONIX) 40 MG tablet    Sig: Take 1 tablet (40 mg total) by mouth daily.    Dispense:  90 tablet  Refill:  1   ondansetron (ZOFRAN) 4 MG tablet    Sig: Take 1 tablet (4 mg total) by mouth every 8 (eight) hours as needed for nausea or vomiting.    Dispense:  20 tablet    Refill:  0   buPROPion (WELLBUTRIN XL) 300 MG 24 hr tablet    Sig: Take 1 tablet (300 mg total) by mouth daily.    Dispense:  90 tablet    Refill:  1   traMADol (ULTRAM) 50 MG tablet    Sig: Take 1 tablet (50 mg total) by mouth 3 (three) times daily as needed. For pain    Dispense:  90 tablet    Refill:  5   MOUNJARO 5 MG/0.5ML Pen    Sig: Inject 5 mg into the skin once a week.    Dispense:  2 mL    Refill:  4   atorvastatin (LIPITOR) 20 MG tablet    Sig: Take 1 tablet (20 mg total) by mouth at bedtime.    Dispense:  90 tablet    Refill:  3   Referral Orders         Ambulatory referral to Sports Medicine       Note is dictated utilizing voice recognition software. Although note has been proof read prior to signing, occasional typographical errors still can be missed. If any questions arise, please do not  hesitate to call for verification.  Electronically signed by: Felix Pacini, DO [facility]

## 2023-05-23 NOTE — Patient Instructions (Addendum)

## 2023-05-24 ENCOUNTER — Encounter: Payer: Self-pay | Admitting: Family Medicine

## 2023-05-24 NOTE — Progress Notes (Unsigned)
    Aleen Sells D.[facility] [address] [place] [address] Phone: [phone]   Assessment and Plan:     There are no diagnoses linked to this encounter.  ***   Pertinent previous records reviewed include ***    Follow Up: ***     Subjective:   I, Jenia Klepper, am serving as a Neurosurgeon for Doctor Richardean Sale  Chief Complaint: neck pain   HPI:   05/25/2023 Patient is a 58 year old female with neck pain. Patient states   Relevant Historical Information: ***  Additional pertinent review of systems negative.   Current Outpatient Medications:    atorvastatin (LIPITOR) 20 MG tablet, Take 1 tablet (20 mg total) by mouth at bedtime., Disp: 90 tablet, Rfl: 3   baclofen (LIORESAL) 10 MG tablet, Take 1 tablet (10 mg total) by mouth 2 (two) times daily., Disp: 180 each, Rfl: 1   buPROPion (WELLBUTRIN XL) 300 MG 24 hr tablet, Take 1 tablet (300 mg total) by mouth daily., Disp: 90 tablet, Rfl: 1   busPIRone (BUSPAR) 15 MG tablet, Take 1 tablet (15 mg total) by mouth as needed., Disp: 90 tablet, Rfl: 1   Cholecalciferol (D3 PO), Take by mouth., Disp: , Rfl:    Cyanocobalamin (B-12) 5000 MCG CAPS, Take by mouth., Disp: , Rfl:    magnesium oxide (MAG-OX) 400 (240 Mg) MG tablet, Take 400 mg by mouth daily., Disp: , Rfl:    meclizine (ANTIVERT) 25 MG tablet, Take 25 mg by mouth 2 (two) times daily as needed., Disp: , Rfl:    MOUNJARO 5 MG/0.5ML Pen, Inject 5 mg into the skin once a week., Disp: 2 mL, Rfl: 4   ondansetron (ZOFRAN) 4 MG tablet, Take 1 tablet (4 mg total) by mouth every 8 (eight) hours as needed for nausea or vomiting., Disp: 20 tablet, Rfl: 0   pantoprazole (PROTONIX) 40 MG tablet, Take 1 tablet (40 mg total) by mouth daily., Disp: 90 tablet, Rfl: 1   rosuvastatin (CRESTOR) 5 MG tablet, Take 1 tablet (5 mg total) by mouth at bedtime., Disp: 90 tablet, Rfl: 1   traMADol (ULTRAM) 50 MG tablet, Take 1 tablet (50 mg total) by mouth  3 (three) times daily as needed. For pain, Disp: 90 tablet, Rfl: 5   valsartan (DIOVAN) 160 MG tablet, Take 1 tablet (160 mg total) by mouth daily., Disp: 90 tablet, Rfl: 1   Objective:     There were no vitals filed for this visit.    There is no height or weight on file to calculate BMI.    Physical Exam:    ***   Electronically signed by:  Aleen Sells D.[facility] 4:15 PM 05/24/23

## 2023-05-25 ENCOUNTER — Ambulatory Visit: Admitting: Sports Medicine

## 2023-05-25 VITALS — BP 126/84 | Ht 62.0 in | Wt 190.0 lb

## 2023-05-25 DIAGNOSIS — M542 Cervicalgia: Secondary | ICD-10-CM

## 2023-05-25 DIAGNOSIS — S46812A Strain of other muscles, fascia and tendons at shoulder and upper arm level, left arm, initial encounter: Secondary | ICD-10-CM | POA: Diagnosis not present

## 2023-05-25 MED ORDER — MELOXICAM 15 MG PO TABS: 15.0000 mg | ORAL_TABLET | Freq: Every day | ORAL | 0 refills | Status: DC

## 2023-05-25 NOTE — Patient Instructions (Signed)
-   Start meloxicam 15 mg daily x2 weeks.  If still having pain after 2 weeks, complete 3rd-week of NSAID. May use remaining NSAID as needed once daily for pain control.  Do not to use additional over-the-counter NSAIDs (ibuprofen, naproxen, Advil, Aleve, etc.) while taking prescription NSAIDs.  May use Tylenol [phone] mg 2 to 3 times a day for breakthrough pain. Trap HEP  Use heating pads 4 week follow up

## 2023-05-28 DIAGNOSIS — H524 Presbyopia: Secondary | ICD-10-CM | POA: Diagnosis not present

## 2023-05-28 DIAGNOSIS — H43393 Other vitreous opacities, bilateral: Secondary | ICD-10-CM | POA: Diagnosis not present

## 2023-05-28 DIAGNOSIS — H04123 Dry eye syndrome of bilateral lacrimal glands: Secondary | ICD-10-CM | POA: Diagnosis not present

## 2023-05-28 LAB — HM DIABETES EYE EXAM

## 2023-06-21 NOTE — Progress Notes (Unsigned)
    Sandy Waters D.[facility] [address] [place] [address] Phone: [phone]   Assessment and Plan:     There are no diagnoses linked to this encounter.  ***   Pertinent previous records reviewed include ***    Follow Up: ***     Subjective:   I, Sandy Waters, am serving as a Neurosurgeon for Doctor Ulysees Gander   Chief Complaint: neck pain    HPI:    05/25/2023 Patient is a 58 year old female with neck pain. Patient states pain and tighess for about 6 months. Achy feeling that has starte to cause blurred vision. Pain does radiate to the left shoulder and to her trap that started about 2  weeks ago. Hx of right shoulder surgery. Tylenol and heat for the pain and that help a lot. Does have some numbness and tingling to her left hand.  Decreased ROM. Has a pressure feeling to her occiput.    06/22/2023 Patient states   Relevant Historical Information: Fibromyalgia, hypertension    Additional pertinent review of systems negative.   Current Outpatient Medications:    atorvastatin  (LIPITOR) 20 MG tablet, Take 1 tablet (20 mg total) by mouth at bedtime., Disp: 90 tablet, Rfl: 3   baclofen  (LIORESAL ) 10 MG tablet, Take 1 tablet (10 mg total) by mouth 2 (two) times daily., Disp: 180 each, Rfl: 1   buPROPion  (WELLBUTRIN  XL) 300 MG 24 hr tablet, Take 1 tablet (300 mg total) by mouth daily., Disp: 90 tablet, Rfl: 1   busPIRone  (BUSPAR ) 15 MG tablet, Take 1 tablet (15 mg total) by mouth as needed., Disp: 90 tablet, Rfl: 1   Cholecalciferol (D3 PO), Take by mouth., Disp: , Rfl:    Cyanocobalamin  (B-12) 5000 MCG CAPS, Take by mouth., Disp: , Rfl:    magnesium oxide (MAG-OX) 400 (240 Mg) MG tablet, Take 400 mg by mouth daily., Disp: , Rfl:    meclizine (ANTIVERT) 25 MG tablet, Take 25 mg by mouth 2 (two) times daily as needed., Disp: , Rfl:    meloxicam  (MOBIC ) 15 MG tablet, Take 1 tablet (15 mg total) by mouth daily., Disp: 30 tablet, Rfl: 0    MOUNJARO 5 MG/0.5ML Pen, Inject 5 mg into the skin once a week., Disp: 2 mL, Rfl: 4   ondansetron  (ZOFRAN ) 4 MG tablet, Take 1 tablet (4 mg total) by mouth every 8 (eight) hours as needed for nausea or vomiting., Disp: 20 tablet, Rfl: 0   pantoprazole  (PROTONIX ) 40 MG tablet, Take 1 tablet (40 mg total) by mouth daily., Disp: 90 tablet, Rfl: 1   rosuvastatin  (CRESTOR ) 5 MG tablet, Take 1 tablet (5 mg total) by mouth at bedtime., Disp: 90 tablet, Rfl: 1   traMADol  (ULTRAM ) 50 MG tablet, Take 1 tablet (50 mg total) by mouth 3 (three) times daily as needed. For pain, Disp: 90 tablet, Rfl: 5   valsartan  (DIOVAN ) 160 MG tablet, Take 1 tablet (160 mg total) by mouth daily., Disp: 90 tablet, Rfl: 1   Objective:     There were no vitals filed for this visit.    There is no height or weight on file to calculate BMI.    Physical Exam:    ***   Electronically signed by:  Marshall Skeeter D.[facility] 7:43 AM 06/21/23

## 2023-06-22 ENCOUNTER — Ambulatory Visit: Admitting: Sports Medicine

## 2023-06-22 VITALS — BP 130/78 | Ht 62.0 in | Wt 183.0 lb

## 2023-06-22 DIAGNOSIS — M542 Cervicalgia: Secondary | ICD-10-CM | POA: Diagnosis not present

## 2023-06-22 DIAGNOSIS — S46812D Strain of other muscles, fascia and tendons at shoulder and upper arm level, left arm, subsequent encounter: Secondary | ICD-10-CM

## 2023-06-22 MED ORDER — MELOXICAM 15 MG PO TABS: 15.0000 mg | ORAL_TABLET | ORAL | 0 refills | Status: DC | PRN

## 2023-06-22 NOTE — Patient Instructions (Signed)
 Tylenol [phone] mg 2-3 times a day for pain relief  May use meloxicam  as needed limit to 1-2 times per week  Meloxicam  refill  Continue HEP and heating pad As needed follow up

## 2023-07-17 ENCOUNTER — Ambulatory Visit
Admission: EM | Admit: 2023-07-17 | Discharge: 2023-07-17 | Disposition: A | Discharge status: Home / Self Care | Attending: Family Medicine | Admitting: Family Medicine

## 2023-07-17 DIAGNOSIS — J988 Other specified respiratory disorders: Secondary | ICD-10-CM

## 2023-07-17 DIAGNOSIS — J309 Allergic rhinitis, unspecified: Secondary | ICD-10-CM | POA: Diagnosis not present

## 2023-07-17 DIAGNOSIS — B9789 Other viral agents as the cause of diseases classified elsewhere: Secondary | ICD-10-CM | POA: Diagnosis not present

## 2023-07-17 MED ORDER — PSEUDOEPHEDRINE HCL 60 MG PO TABS: 60.0000 mg | ORAL_TABLET | Freq: Three times a day (TID) | ORAL | 0 refills | Status: DC | PRN

## 2023-07-17 MED ORDER — IBUPROFEN 400 MG PO TABS: 400.0000 mg | ORAL_TABLET | Freq: Four times a day (QID) | ORAL | 0 refills | Status: DC | PRN

## 2023-07-17 MED ORDER — CETIRIZINE HCL 10 MG PO TABS: 10.0000 mg | ORAL_TABLET | Freq: Every day | ORAL | 0 refills | Status: AC

## 2023-07-17 MED ORDER — DEXAMETHASONE SODIUM PHOSPHATE 10 MG/ML IJ SOLN
10.0000 mg | Freq: Once | INTRAMUSCULAR | Status: AC
  Administered 2023-07-17: 10 mg via INTRAMUSCULAR

## 2023-07-17 NOTE — Discharge Instructions (Addendum)
 We will manage this as a viral illness. For sore throat or cough try using a honey-based tea. Use 3 teaspoons of honey with juice squeezed from half lemon. Place shaved pieces of ginger into 1/2-1 cup of water and warm over stove top. Then mix the ingredients and repeat every 4 hours as needed. Please take Tylenol 500mg -650mg  every 6 hours for throat pain, fevers, aches and pains. Hydrate very well with at least 2 liters of water. Eat light meals such as soups (chicken and noodles, vegetable, chicken and wild rice).  Do not eat foods that you are allergic to.  Taking an antihistamine like Zyrtec  (10mg  daily) can help against postnasal drainage, sinus congestion which can cause sinus pain, sinus headaches, throat pain, painful swallowing, coughing.  You can take this together with pseudoephedrine  (Sudafed) at a dose of 60 mg 3 times a day or twice daily as needed for the same kind of nasal drip, congestion.    Since you have received a steroid injection in clinic, do not use any nonsteroidal anti-inflammatories (NSAIDs) like ibuprofen , Motrin , naproxen, Aleve, etc. which are all available over-the-counter.  Please just use Tylenol. For the same reason, since you have received the steroid injection, pseudoephedrine  can make you feel jittery so avoid the use if this happens.

## 2023-07-17 NOTE — ED Triage Notes (Signed)
 Patient reports left side sinus pressure and ear pain x 4 days. Patient has taken otc allergy pill that seems to help.

## 2023-07-17 NOTE — ED Provider Notes (Signed)
 [facility]  Note:  This document was prepared using Conservation officer, historic buildings and may include unintentional dictation errors.  MRN: [id number] DOB: 16-Aug-1965  Subjective:   Sandy Waters is a 58 y.o. female presenting for 4-day history of persistent sinus congestion, sinus drainage, left ear fullness and pain, throat pain.  No chest pain, headache, confusion, weakness, numbness or tingling, heart racing, palpitations, nausea, vomiting, abdominal pain, left arm pain.  No history of heart disease.  No history of kidney disease.  Patient does use meloxicam  as needed for chronic neck pain.  She is currently being managed for this with Ortho.  No smoking.  No asthma.  She does have a history of allergies and took an over-the-counter sinus allergy medication with some relief.  However, she would like to have a steroid injection to help her feel better with her sinuses.  Has diabetes, is very well-controlled with an A1c less than 6%.  No current facility-administered medications for this encounter.  Current Outpatient Medications:    atorvastatin  (LIPITOR) 20 MG tablet, Take 1 tablet (20 mg total) by mouth at bedtime., Disp: 90 tablet, Rfl: 3   buPROPion  (WELLBUTRIN  XL) 300 MG 24 hr tablet, Take 1 tablet (300 mg total) by mouth daily., Disp: 90 tablet, Rfl: 1   busPIRone  (BUSPAR ) 15 MG tablet, Take 1 tablet (15 mg total) by mouth as needed., Disp: 90 tablet, Rfl: 1   Cholecalciferol (D3 PO), Take by mouth., Disp: , Rfl:    Cyanocobalamin  (B-12) 5000 MCG CAPS, Take by mouth., Disp: , Rfl:    magnesium oxide (MAG-OX) 400 (240 Mg) MG tablet, Take 400 mg by mouth daily., Disp: , Rfl:    MOUNJARO 5 MG/0.5ML Pen, Inject 5 mg into the skin once a week., Disp: 2 mL, Rfl: 4   pantoprazole  (PROTONIX ) 40 MG tablet, Take 1 tablet (40 mg total) by mouth daily., Disp: 90 tablet, Rfl: 1   traMADol  (ULTRAM ) 50 MG tablet, Take 1 tablet (50 mg total) by mouth 3 (three) times daily  as needed. For pain, Disp: 90 tablet, Rfl: 5   valsartan  (DIOVAN ) 160 MG tablet, Take 1 tablet (160 mg total) by mouth daily., Disp: 90 tablet, Rfl: 1   baclofen  (LIORESAL ) 10 MG tablet, Take 1 tablet (10 mg total) by mouth 2 (two) times daily., Disp: 180 each, Rfl: 1   meclizine (ANTIVERT) 25 MG tablet, Take 25 mg by mouth 2 (two) times daily as needed., Disp: , Rfl:    meloxicam  (MOBIC ) 15 MG tablet, Take 1 tablet (15 mg total) by mouth as needed for pain., Disp: 30 tablet, Rfl: 0   ondansetron  (ZOFRAN ) 4 MG tablet, Take 1 tablet (4 mg total) by mouth every 8 (eight) hours as needed for nausea or vomiting., Disp: 20 tablet, Rfl: 0   rosuvastatin  (CRESTOR ) 5 MG tablet, Take 1 tablet (5 mg total) by mouth at bedtime., Disp: 90 tablet, Rfl: 1   Allergies  Allergen Reactions   Macrodantin [Nitrofurantoin Macrocrystal] Swelling    Face swells   Sulfa Antibiotics Swelling    microdantin   Latex Itching and Rash    Band aids    Past Medical History:  Diagnosis Date   Acid reflux    Anxiety    Arthritis    Carpal tunnel syndrome, left upper limb 09/21/2017   She had a NCV with EMG performed on 04/06/17.  The study revealed a borderline to very mild median nerve entrapment at the left wrist.  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   Depression    Fibromyalgia    2012   Frequent headaches    Hiatal hernia 10/14/2010   History of cholecystectomy 07/10/2014   History of stomach ulcers 09/21/2017   Hypertension    IBS (irritable bowel syndrome) 2019   Migraines 2019   Non-alcoholic fatty liver disease 04/2017   Other insomnia 09/21/2017   Thyroid  disease      Past Surgical History:  Procedure Laterality Date   CESAREAN SECTION  1986   1986   CHOLECYSTECTOMY  2010   2010   COLONOSCOPY  2014   ?2014 AND REPORTEDLY NORMAL   COLONOSCOPY  05/2006   Dr. Lars Poche D'Oro: few scattered ulcerations in lower rectum likely related to prep,  toruous sigmoid colon difficult to pass, few diverticula in sigmoid colon.    ESOPHAGOGASTRODUODENOSCOPY  11/08/2013   Dr. Mariea Shook: normal. biopsies from proximal and distal esophagus with no increased intraepithelial eosinophils, no intestinal metaplasia. gastric bx with chronic active gastritis, no H.pylori   ESOPHAGOGASTRODUODENOSCOPY  09/2010   Dr. Alease Hunter: bile reflux, small hiatal hernia   ESOPHAGOGASTRODUODENOSCOPY  02/2005   Dr. Lars Poche D'Oro: gastritis mostly in the antrum, superficial ulcerations in the stomach, mild duodenitis, hiatal hernia. gastric bx negative.   SHOULDER SURGERY Right 08/30/2016   bone spur/scar tissue removed    TONSILLECTOMY  1984   1984    Family History  Problem Relation Age of Onset   Heart disease Mother    Hypertension Mother    Diabetes Mother    Breast cancer Mother    Depression Father    Drug abuse Father    Early death Father    Prostate cancer Father        prostate    Hypertension Sister    Hypertension Brother    Schizophrenia Brother    Diabetes Brother    Heart disease Maternal Grandmother    Scoliosis Daughter    Colon cancer Neg Hx    Colon polyps Neg Hx     Social History   Tobacco Use   Smoking status: Former    Current packs/day: 0.00    Types: Cigarettes    Quit date: 02/28/1993    Years since quitting: 30.4   Smokeless tobacco: Never  Vaping Use   Vaping status: Never Used  Substance Use Topics   Alcohol  use: Yes    Comment: occ   Drug use: Never    ROS   Objective:   Vitals: BP 127/89 (BP Location: Left Arm)   Pulse (!) 59   Temp 98.4 F (36.9 C) (Oral)   Resp 16   SpO2 97%   Physical Exam Constitutional:      General: She is not in acute distress.    Appearance: Normal appearance. She is well-developed and normal weight. She is not ill-appearing, toxic-appearing or diaphoretic.  HENT:     Head: Normocephalic and atraumatic.     Right Ear: Tympanic membrane, ear canal and external ear normal. No  drainage or tenderness. No middle ear effusion. There is no impacted cerumen. Tympanic membrane is not erythematous or bulging.     Left Ear: Ear canal and external ear normal. No drainage or tenderness.  No middle ear effusion. There is no impacted cerumen. Tympanic membrane is not erythematous or bulging.     Ears:     Comments: Middle ear of the left ear.  Nose: Congestion present. No rhinorrhea.     Mouth/Throat:     Mouth: Mucous membranes are moist. No oral lesions.     Pharynx: No pharyngeal swelling, oropharyngeal exudate, posterior oropharyngeal erythema or uvula swelling.     Tonsils: No tonsillar exudate or tonsillar abscesses.  Eyes:     General: No scleral icterus.       Right eye: No discharge.        Left eye: No discharge.     Extraocular Movements: Extraocular movements intact.     Right eye: Normal extraocular motion.     Left eye: Normal extraocular motion.     Conjunctiva/sclera: Conjunctivae normal.  Cardiovascular:     Rate and Rhythm: Normal rate and regular rhythm.     Heart sounds: Normal heart sounds. No murmur heard.    No friction rub. No gallop.  Pulmonary:     Effort: Pulmonary effort is normal. No respiratory distress.     Breath sounds: No stridor. No wheezing, rhonchi or rales.  Chest:     Chest wall: No tenderness.  Musculoskeletal:     Cervical back: Normal range of motion and neck supple.  Lymphadenopathy:     Cervical: No cervical adenopathy.  Skin:    General: Skin is warm and dry.  Neurological:     General: No focal deficit present.     Mental Status: She is alert and oriented to person, place, and time.  Psychiatric:        Mood and Affect: Mood normal.        Behavior: Behavior normal.    IM dexamethasone  10 mg administered in clinic.  Assessment and Plan :   PDMP not reviewed this encounter.  1. Viral respiratory infection   2. Allergic rhinitis, unspecified seasonality, unspecified trigger    Suspect a viral respiratory  infection likely made worse by her acute on chronic allergic rhinitis.  Was agreeable to using dexamethasone  as above.  Recommend supportive care otherwise.  Low suspicion for ACS.  Diabetes well-controlled, has no smoking history.  Counseled patient on potential for adverse effects with medications prescribed/recommended today, ER and return-to-clinic precautions discussed, patient verbalized understanding.    Adolph Hoop, PA-C 07/17/23 1022

## 2023-08-16 DIAGNOSIS — R69 Illness, unspecified: Secondary | ICD-10-CM | POA: Diagnosis not present

## 2023-08-23 ENCOUNTER — Ambulatory Visit: Admitting: Family Medicine

## 2023-08-23 ENCOUNTER — Encounter: Payer: Self-pay | Admitting: Family Medicine

## 2023-08-23 VITALS — BP 128/76 | HR 65 | Temp 98.2°F | Wt 169.0 lb

## 2023-08-23 DIAGNOSIS — I10 Essential (primary) hypertension: Secondary | ICD-10-CM

## 2023-08-23 DIAGNOSIS — K760 Fatty (change of) liver, not elsewhere classified: Secondary | ICD-10-CM | POA: Diagnosis not present

## 2023-08-23 DIAGNOSIS — E1169 Type 2 diabetes mellitus with other specified complication: Secondary | ICD-10-CM | POA: Diagnosis not present

## 2023-08-23 DIAGNOSIS — Z7985 Long-term (current) use of injectable non-insulin antidiabetic drugs: Secondary | ICD-10-CM | POA: Diagnosis not present

## 2023-08-23 DIAGNOSIS — E785 Hyperlipidemia, unspecified: Secondary | ICD-10-CM | POA: Diagnosis not present

## 2023-08-23 LAB — POCT GLYCOSYLATED HEMOGLOBIN (HGB A1C)
HbA1c POC (<> result, manual entry): 5.5 % (ref 4.0–5.6)
HbA1c, POC (controlled diabetic range): 5.5 % — AB (ref 0.0–7.0)
HbA1c, POC (prediabetic range): 5.5 % — AB (ref 5.7–6.4)
Hemoglobin A1C: 5.5 % (ref 4.0–5.6)

## 2023-08-23 MED ORDER — TIRZEPATIDE 7.5 MG/0.5ML ~~LOC~~ SOAJ: 7.5000 mg | SUBCUTANEOUS | 5 refills | Status: DC

## 2023-08-23 MED ORDER — BUPROPION HCL ER (XL) 300 MG PO TB24: 300.0000 mg | ORAL_TABLET | Freq: Every day | ORAL | 1 refills | Status: DC

## 2023-08-23 MED ORDER — VALSARTAN 160 MG PO TABS: 160.0000 mg | ORAL_TABLET | Freq: Every day | ORAL | 1 refills | Status: DC

## 2023-08-23 MED ORDER — BACLOFEN 10 MG PO TABS: 10.0000 mg | ORAL_TABLET | Freq: Two times a day (BID) | ORAL | 1 refills | Status: DC

## 2023-08-23 MED ORDER — BUSPIRONE HCL 15 MG PO TABS: 15.0000 mg | ORAL_TABLET | ORAL | 1 refills | Status: DC | PRN

## 2023-08-23 MED ORDER — PANTOPRAZOLE SODIUM 40 MG PO TBEC: 40.0000 mg | DELAYED_RELEASE_TABLET | Freq: Every day | ORAL | 1 refills | Status: DC

## 2023-08-23 MED ORDER — TRAMADOL HCL 50 MG PO TABS: 50.0000 mg | ORAL_TABLET | Freq: Three times a day (TID) | ORAL | 5 refills | Status: DC | PRN

## 2023-08-23 MED ORDER — ROSUVASTATIN CALCIUM 5 MG PO TABS: 5.0000 mg | ORAL_TABLET | Freq: Every day | ORAL | 1 refills | Status: DC

## 2023-08-23 NOTE — Patient Instructions (Addendum)
 No follow-ups on file.     Vitals - 1 value per visit     Weight (lb) Height BMI  02/13/2023 217.8  5' 4.17 (1.63 m)  37.18 kg/m2   03/03/2023 209.6  5' 2 (1.575 m)  38.34 kg/m2   05/23/2023 190.4   34.82 kg/m2   05/25/2023 190  5' 2 (1.575 m)  34.75 kg/m2   06/22/2023 183  5' 2 (1.575 m)  33.47 kg/m2   07/17/2023     08/23/2023 169   30.91 kg/m2        Great to see you today.  I have refilled the medication(s) we provide.   If labs were collected or images ordered, we will inform you of  results once we have received them and reviewed. We will contact you either by echart message, or telephone call.  Please give ample time to the testing facility, and our office to run,  receive and review results. Please do not call inquiring of results, even if you can see them in your chart. We will contact you as soon as we are able. If it has been over 1 week since the test was completed, and you have not yet heard from us , then please call us .    - echart message- for normal results that have been seen by the patient already.   - telephone call: abnormal results or if patient has not viewed results in their echart.  If a referral to a specialist was entered for you, please call us  in 2 weeks if you have not heard from the specialist office to schedule.

## 2023-08-23 NOTE — Progress Notes (Signed)
 Patient ID: Sandy Waters, female  DOB: 11-Feb-1966, 58 y.o.   MRN: [id number] Patient Care Team    Relationship Specialty Notifications Start End  Catherine Charlies LABOR, DO PCP - General Family Medicine  12/06/22   Harvey Margo CROME, MD (Inactive) Consulting Physician Gastroenterology  05/17/17   May Commander, MD Referring Physician Obstetrics and Gynecology  12/06/22   Joshua Rush, OD  Optometry  12/06/22   Ezzard Sonny RAMAN, PA-C Physician Assistant Gastroenterology  12/06/22    Comment: [facility]    Chief Complaint  Patient presents with   Diabetes    Chronic Conditions/illness Management     Subjective: Sandy Waters is a 58 y.o.  female present for chronic condition management appointment All past medical history, surgical history, allergies, family history, immunizations, medications and social history were updated in the electronic medical record today. All recent labs, ED visits and hospitalizations within the last year were reviewed.  Primary hypertension Pt reports compliance with Diovan  160 mg. Blood pressures ranges at home.  Patient denies chest pain, shortness of breath, dizziness or lower extremity edema.  Pt is  prescribed statin.   Anxiety and depression Patient reports compliance Wellbutrin  300 mg daily.  She has been on this medication for some time.  She uses BuSpar  only when needed.  Fibromyalgia/Degeneration of intervertebral disc of lumbar region with discogenic back pain and lower extremity pain Patient reports she has been diagnosed with fibromyalgia around 2012.   She also experiences low back pain from her lumbar degenerative disc disease.  She uses baclofen  when needed and tramadol  approximately 1-2 times daily, 3 times daily with a flare.  Gastroesophageal reflux disease without esophagitis Patient reports she compliance with Protonix  40 mg daily.  Diabetes type 2 Pt reports compliance with Mounjaro  5 mg weekly and is tolerating  taper.  Patient denies dizziness, hyperglycemic or hypoglycemic events.  Patient denies dizziness, hyperglycemic or hypoglycemic events. Patient denies numbness, tingling in the extremities or nonhealing wounds of feet.       05/23/2023    1:07 PM 02/13/2023    1:53 PM 12/06/2022    1:13 PM  Depression screen PHQ 2/9  Decreased Interest 0 1 0  Down, Depressed, Hopeless 0 0 0  PHQ - 2 Score 0 1 0  Altered sleeping 0  1  Tired, decreased energy 0  1  Change in appetite 0  1  Feeling bad or failure about yourself  0  0  Trouble concentrating 0  0  Moving slowly or fidgety/restless 0  0  Suicidal thoughts 0  0  PHQ-9 Score 0  3  Difficult doing work/chores Not difficult at all  Not difficult at all      05/23/2023    1:07 PM 12/06/2022    1:13 PM  GAD 7 : Generalized Anxiety Score  Nervous, Anxious, on Edge 0 1  Control/stop worrying 0 0  Worry too much - different things 0 0  Trouble relaxing 0 0  Restless 0 0  Easily annoyed or irritable 0 0  Afraid - awful might happen 0 0  Total GAD 7 Score 0 1  Anxiety Difficulty Not difficult at all Not difficult at all           05/23/2023    1:07 PM 02/13/2023    1:53 PM 12/06/2022    1:13 PM  Fall Risk   Falls in the past year? 0 0 0  Number falls in past yr:  0 0  Injury with Fall?  0 0  Risk for fall due to :  No Fall Risks No Fall Risks  Follow up Falls evaluation completed Falls evaluation completed Falls evaluation completed    Immunization History  Administered Date(s) Administered   Influenza, Seasonal, Injecte, Preservative Fre 01/03/2013   Influenza-Unspecified 12/18/2007   PNEUMOCOCCAL CONJUGATE-20 05/23/2023   Tdap 07/05/2011    No results found.  Past Medical History:  Diagnosis Date   Acid reflux    Anxiety    Arthritis    Carpal tunnel syndrome, left upper limb 09/21/2017   She had a NCV with EMG performed on 04/06/17.  The study revealed a borderline to very mild median nerve entrapment at the left  wrist.      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   Depression    Fibromyalgia    2012   Frequent headaches    Hiatal hernia 10/14/2010   History of cholecystectomy 07/10/2014   History of stomach ulcers 09/21/2017   Hypertension    IBS (irritable bowel syndrome) 2019   Migraines 2019   Non-alcoholic fatty liver disease 04/2017   Other insomnia 09/21/2017   Thyroid  disease    Allergies  Allergen Reactions   Macrodantin [Nitrofurantoin Macrocrystal] Swelling    Face swells   Sulfa Antibiotics Swelling    microdantin   Latex Itching and Rash    Band aids   Past Surgical History:  Procedure Laterality Date   CESAREAN SECTION  1986   1986   CHOLECYSTECTOMY  2010   2010   COLONOSCOPY  2014   ?2014 AND REPORTEDLY NORMAL   COLONOSCOPY  05/2006   Dr. Morton D'Oro: few scattered ulcerations in lower rectum likely related to prep, toruous sigmoid colon difficult to pass, few diverticula in sigmoid colon.    ESOPHAGOGASTRODUODENOSCOPY  11/08/2013   Dr. Garlan: normal. biopsies from proximal and distal esophagus with no increased intraepithelial eosinophils, no intestinal metaplasia. gastric bx with chronic active gastritis, no H.pylori   ESOPHAGOGASTRODUODENOSCOPY  09/2010   Dr. Lauraine Denver: bile reflux, small hiatal hernia   ESOPHAGOGASTRODUODENOSCOPY  02/2005   Dr. Morton D'Oro: gastritis mostly in the antrum, superficial ulcerations in the stomach, mild duodenitis, hiatal hernia. gastric bx negative.   SHOULDER SURGERY Right 08/30/2016   bone spur/scar tissue removed    TONSILLECTOMY  1984   1984   Family History  Problem Relation Age of Onset   Heart disease Mother    Hypertension Mother    Diabetes Mother    Breast cancer Mother    Depression Father    Drug abuse Father    Early death Father    Prostate cancer Father        prostate    Hypertension Sister    Hypertension Brother    Schizophrenia Brother    Diabetes  Brother    Heart disease Maternal Grandmother    Scoliosis Daughter    Colon cancer Neg Hx    Colon polyps Neg Hx    Social History   Social History Narrative   Not on file    Allergies as of 08/23/2023       Reactions   Macrodantin [nitrofurantoin Macrocrystal] Swelling   Face swells   Sulfa Antibiotics Swelling   microdantin   Latex Itching, Rash   Band aids        Medication List        Accurate as  of August 23, 2023  4:14 PM. If you have any questions, ask your nurse or doctor.          STOP taking these medications    Mounjaro  5 MG/0.5ML Pen Generic drug: tirzepatide  Replaced by: tirzepatide  7.5 MG/0.5ML Pen Stopped by: Charlies Bellini   pseudoephedrine  60 MG tablet Commonly known as: SUDAFED Stopped by: Charlies Bellini       TAKE these medications    atorvastatin  20 MG tablet Commonly known as: LIPITOR Take 1 tablet (20 mg total) by mouth at bedtime.   B-12 5000 MCG Caps Take by mouth.   baclofen  10 MG tablet Commonly known as: LIORESAL  Take 1 tablet (10 mg total) by mouth 2 (two) times daily.   buPROPion  300 MG 24 hr tablet Commonly known as: WELLBUTRIN  XL Take 1 tablet (300 mg total) by mouth daily.   busPIRone  15 MG tablet Commonly known as: BUSPAR  Take 1 tablet (15 mg total) by mouth as needed.   cetirizine  10 MG tablet Commonly known as: ZyrTEC  Allergy Take 1 tablet (10 mg total) by mouth daily.   D3 PO Take by mouth.   magnesium oxide 400 (240 Mg) MG tablet Commonly known as: MAG-OX Take 400 mg by mouth daily.   meclizine 25 MG tablet Commonly known as: ANTIVERT Take 25 mg by mouth 2 (two) times daily as needed.   meloxicam  15 MG tablet Commonly known as: MOBIC  Take 1 tablet (15 mg total) by mouth as needed for pain.   ondansetron  4 MG tablet Commonly known as: ZOFRAN  Take 1 tablet (4 mg total) by mouth every 8 (eight) hours as needed for nausea or vomiting.   pantoprazole  40 MG tablet Commonly known as: PROTONIX  Take 1  tablet (40 mg total) by mouth daily.   rosuvastatin  5 MG tablet Commonly known as: CRESTOR  Take 1 tablet (5 mg total) by mouth at bedtime.   tirzepatide  7.5 MG/0.5ML Pen Commonly known as: MOUNJARO  Inject 7.5 mg into the skin once a week. Replaces: Mounjaro  5 MG/0.5ML Pen Started by: Charlies Bellini   traMADol  50 MG tablet Commonly known as: ULTRAM  Take 1 tablet (50 mg total) by mouth 3 (three) times daily as needed. For pain   valsartan  160 MG tablet Commonly known as: DIOVAN  Take 1 tablet (160 mg total) by mouth daily.        All past medical history, surgical history, allergies, family history, immunizations andmedications were updated in the EMR today and reviewed under the history and medication portions of their EMR.      No results found.   ROS 14 pt review of systems performed and negative (unless mentioned in an HPI)  Objective: BP 128/76   Pulse 65   Temp 98.2 F (36.8 C)   Wt 169 lb (76.7 kg)   SpO2 96%   BMI 30.91 kg/m  Physical Exam Vitals and nursing note reviewed.  Constitutional:      General: She is not in acute distress.    Appearance: Normal appearance. She is not ill-appearing, toxic-appearing or diaphoretic.  HENT:     Head: Normocephalic and atraumatic.   Eyes:     General: No scleral icterus.       Right eye: No discharge.        Left eye: No discharge.     Extraocular Movements: Extraocular movements intact.     Conjunctiva/sclera: Conjunctivae normal.     Pupils: Pupils are equal, round, and reactive to light.    Cardiovascular:  Rate and Rhythm: Normal rate and regular rhythm.     Heart sounds: No murmur heard. Pulmonary:     Effort: Pulmonary effort is normal. No respiratory distress.     Breath sounds: Normal breath sounds. No wheezing, rhonchi or rales.   Musculoskeletal:     Cervical back: Neck supple.     Right lower leg: No edema.     Left lower leg: No edema.   Skin:    General: Skin is warm.     Findings: No  rash.   Neurological:     Mental Status: She is alert and oriented to person, place, and time. Mental status is at baseline.     Motor: No weakness.     Gait: Gait normal.   Psychiatric:        Mood and Affect: Mood normal.        Behavior: Behavior normal.        Thought Content: Thought content normal.        Judgment: Judgment normal.      Results for orders placed or performed in visit on 08/23/23 (from the past 48 hours)  POCT HgB A1C     Status: Abnormal   Collection Time: 08/23/23  1:39 PM  Result Value Ref Range   Hemoglobin A1C 5.5 4.0 - 5.6 %   HbA1c POC (<> result, manual entry) 5.5 4.0 - 5.6 %   HbA1c, POC (prediabetic range) 5.5 (A) 5.7 - 6.4 %   HbA1c, POC (controlled diabetic range) 5.5 (A) 0.0 - 7.0 %      Assessment/plan: SADIA BELFIORE is a 57 y.o. female present for  chronic condition management Primary hypertension/hyperlipidemia/family history of heart disease Stable Continue Diovan  160 mg daily. -Routine exercise - Low-sodium diet  Anxiety and depression Stable Continue Wellbutrin  300 mg daily Continue BuSpar  15 mg daily as needed  Fibromyalgia/Degeneration of intervertebral disc of lumbar region with discogenic back pain and lower extremity pain Stable Continue baclofen  10 mg twice daily as needed Continue   tramadol  50 mg 2-3 times daily as needed [place]  controlled substance database reviewed and appropriate 08/23/23  Neck pain/neck spasms rash elevated first rib continue muscle relaxants as needed, Has been  referred to sports med for OMT evaluate and treat  Gastroesophageal reflux disease without esophagitis Stable Continue  Protonix  40 mg daily. Monitor B12, vitamin D  (UTD 01/2023) and mag every 2-3 years.  Diabetes doing great! Lost 48 pounds Increase to Mounjaro  7.5 mg weekly injection for - Hemoglobin A1c 6.6 > 5.3 > 5.5 today PNA series: completed today 05/23/2023 Flu shot: Declined (recommneded yearly) GFR-UTD  01/2023 Foot exam: Completed today 05/23/2023 Eye exam: Discussed yearly ophthalmology exams for diabetes-[facility]> requested Microalbumin collected today 05/23/2023  Return in about 3 months (around 11/27/2023) for Routine chronic condition follow-up.  Orders Placed This Encounter  Procedures   POCT HgB A1C   Meds ordered this encounter  Medications   baclofen  (LIORESAL ) 10 MG tablet    Sig: Take 1 tablet (10 mg total) by mouth 2 (two) times daily.    Dispense:  180 each    Refill:  1   rosuvastatin  (CRESTOR ) 5 MG tablet    Sig: Take 1 tablet (5 mg total) by mouth at bedtime.    Dispense:  90 tablet    Refill:  1   valsartan  (DIOVAN ) 160 MG tablet    Sig: Take 1 tablet (160 mg total) by mouth daily.    Dispense:  90 tablet    Refill:  1   pantoprazole  (PROTONIX ) 40 MG tablet    Sig: Take 1 tablet (40 mg total) by mouth daily.    Dispense:  90 tablet    Refill:  1   buPROPion  (WELLBUTRIN  XL) 300 MG 24 hr tablet    Sig: Take 1 tablet (300 mg total) by mouth daily.    Dispense:  90 tablet    Refill:  1   busPIRone  (BUSPAR ) 15 MG tablet    Sig: Take 1 tablet (15 mg total) by mouth as needed.    Dispense:  90 tablet    Refill:  1   traMADol  (ULTRAM ) 50 MG tablet    Sig: Take 1 tablet (50 mg total) by mouth 3 (three) times daily as needed. For pain    Dispense:  90 tablet    Refill:  5   tirzepatide  (MOUNJARO ) 7.5 MG/0.5ML Pen    Sig: Inject 7.5 mg into the skin once a week.    Dispense:  2 mL    Refill:  5   Referral Orders  No referral(s) requested today      Note is dictated utilizing voice recognition software. Although note has been proof read prior to signing, occasional typographical errors still can be missed. If any questions arise, please do not hesitate to call for verification.  Electronically signed by: Charlies Bellini, DO [facility] [place]

## 2023-09-11 ENCOUNTER — Encounter: Payer: Self-pay | Admitting: Family Medicine

## 2023-09-11 ENCOUNTER — Ambulatory Visit: Payer: Medicare HMO | Admitting: Family Medicine

## 2023-09-11 VITALS — BP 129/83 | HR 76 | Temp 98.3°F | Wt 168.6 lb

## 2023-09-11 DIAGNOSIS — E559 Vitamin D deficiency, unspecified: Secondary | ICD-10-CM

## 2023-09-11 DIAGNOSIS — F32A Depression, unspecified: Secondary | ICD-10-CM

## 2023-09-11 DIAGNOSIS — K219 Gastro-esophageal reflux disease without esophagitis: Secondary | ICD-10-CM | POA: Diagnosis not present

## 2023-09-11 DIAGNOSIS — Z7985 Long-term (current) use of injectable non-insulin antidiabetic drugs: Secondary | ICD-10-CM | POA: Diagnosis not present

## 2023-09-11 DIAGNOSIS — E1169 Type 2 diabetes mellitus with other specified complication: Secondary | ICD-10-CM | POA: Diagnosis not present

## 2023-09-11 DIAGNOSIS — I10 Essential (primary) hypertension: Secondary | ICD-10-CM | POA: Diagnosis not present

## 2023-09-11 DIAGNOSIS — F419 Anxiety disorder, unspecified: Secondary | ICD-10-CM | POA: Diagnosis not present

## 2023-09-11 DIAGNOSIS — E785 Hyperlipidemia, unspecified: Secondary | ICD-10-CM

## 2023-09-11 NOTE — Patient Instructions (Signed)
   Great to see you today.  I have refilled the medication(s) we provide.   If labs were collected or images ordered, we will inform you of  results once we have received them and reviewed. We will contact you either by echart message, or telephone call.  Please give ample time to the testing facility, and our office to run,  receive and review results. Please do not call inquiring of results, even if you can see them in your chart. We will contact you as soon as we are able. If it has been over 1 week since the test was completed, and you have not yet heard from [place], then please call us.    - echart message- for normal results that have been seen by the patient already.   - telephone call: abnormal results or if patient has not viewed results in their echart.  If a referral to a specialist was entered for you, please call us in 2 weeks if you have not heard from the specialist office to schedule.

## 2023-09-11 NOTE — Progress Notes (Signed)
 Patient ID: Sandy Waters, female  DOB: 02/22/1966, 58 y.o.   MRN: [id number] Patient Care Team    Relationship Specialty Notifications Start End  Catherine Charlies LABOR, DO PCP - General Family Medicine  12/06/22   Harvey Margo CROME, MD (Inactive) Consulting Physician Gastroenterology  05/17/17   May Commander, MD Referring Physician Obstetrics and Gynecology  12/06/22   Joshua Rush, OD  Optometry  12/06/22   Ezzard Sonny RAMAN, PA-C Physician Assistant Gastroenterology  12/06/22    Comment: [facility]    Chief Complaint  Patient presents with   Diabetes    And weight loss    Subjective: Sandy Waters is a 58 y.o.  female present for chronic condition management appointment All past medical history, surgical history, allergies, family history, immunizations, medications and social history were updated in the electronic medical record today. All recent labs, ED visits and hospitalizations within the last year were reviewed.  Diabetes type 2 Pt reports compliance with Mounjaro  5 mg weekly and is tolerating taper.  She states she starts her Mounjaro  7.5 mg injection tomorrow. Patient denies hyperglycemic or hypoglycemic events. Patient denies numbness, tingling in the extremities or nonhealing wounds of feet.   She reports her goal weight would be about 160 pounds. She has made dietary changes She is exercising      09/11/2023    1:31 PM 05/23/2023    1:07 PM 02/13/2023    1:53 PM 12/06/2022    1:13 PM  Depression screen PHQ 2/9  Decreased Interest 0 0 1 0  Down, Depressed, Hopeless 0 0 0 0  PHQ - 2 Score 0 0 1 0  Altered sleeping 0 0  1  Tired, decreased energy 0 0  1  Change in appetite 0 0  1  Feeling bad or failure about yourself  0 0  0  Trouble concentrating 0 0  0  Moving slowly or fidgety/restless 0 0  0  Suicidal thoughts 0 0  0  PHQ-9 Score 0 0  3  Difficult doing work/chores Not difficult at all Not difficult at all  Not difficult at all       09/11/2023    1:31 PM 05/23/2023    1:07 PM 12/06/2022    1:13 PM  GAD 7 : Generalized Anxiety Score  Nervous, Anxious, on Edge 0 0 1  Control/stop worrying 0 0 0  Worry too much - different things 0 0 0  Trouble relaxing 0 0 0  Restless 0 0 0  Easily annoyed or irritable 0 0 0  Afraid - awful might happen 0 0 0  Total GAD 7 Score 0 0 1  Anxiety Difficulty Not difficult at all Not difficult at all Not difficult at all           09/11/2023    1:31 PM 05/23/2023    1:07 PM 02/13/2023    1:53 PM 12/06/2022    1:13 PM  Fall Risk   Falls in the past year? 0 0 0 0  Number falls in past yr:   0 0  Injury with Fall?   0 0  Risk for fall due to :   No Fall Risks No Fall Risks  Follow up Falls evaluation completed Falls evaluation completed Falls evaluation completed Falls evaluation completed    Immunization History  Administered Date(s) Administered   Influenza, Seasonal, Injecte, Preservative Fre 01/03/2013   Influenza-Unspecified 12/18/2007   PNEUMOCOCCAL CONJUGATE-20 05/23/2023   Tdap  07/05/2011    No results found.  Past Medical History:  Diagnosis Date   Acid reflux    Anxiety    Arthritis    Carpal tunnel syndrome, left upper limb 09/21/2017   She had a NCV with EMG performed on 04/06/17.  The study revealed a borderline to very mild median nerve entrapment at the left wrist.      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   Depression    Fibromyalgia    2012   Frequent headaches    Hiatal hernia 10/14/2010   History of cholecystectomy 07/10/2014   History of stomach ulcers 09/21/2017   Hypertension    IBS (irritable bowel syndrome) 2019   Migraines 2019   Non-alcoholic fatty liver disease 04/2017   Other insomnia 09/21/2017   Thyroid  disease    Allergies  Allergen Reactions   Macrodantin [Nitrofurantoin Macrocrystal] Swelling    Face swells   Sulfa Antibiotics Swelling    microdantin   Latex Itching and Rash     Band aids   Past Surgical History:  Procedure Laterality Date   CESAREAN SECTION  1986   1986   CHOLECYSTECTOMY  2010   2010   COLONOSCOPY  2014   ?2014 AND REPORTEDLY NORMAL   COLONOSCOPY  05/2006   Dr. Morton D'Oro: few scattered ulcerations in lower rectum likely related to prep, toruous sigmoid colon difficult to pass, few diverticula in sigmoid colon.    ESOPHAGOGASTRODUODENOSCOPY  11/08/2013   Dr. Garlan: normal. biopsies from proximal and distal esophagus with no increased intraepithelial eosinophils, no intestinal metaplasia. gastric bx with chronic active gastritis, no H.pylori   ESOPHAGOGASTRODUODENOSCOPY  09/2010   Dr. Lauraine Denver: bile reflux, small hiatal hernia   ESOPHAGOGASTRODUODENOSCOPY  02/2005   Dr. Morton D'Oro: gastritis mostly in the antrum, superficial ulcerations in the stomach, mild duodenitis, hiatal hernia. gastric bx negative.   SHOULDER SURGERY Right 08/30/2016   bone spur/scar tissue removed    TONSILLECTOMY  1984   1984   Family History  Problem Relation Age of Onset   Heart disease Mother    Hypertension Mother    Diabetes Mother    Breast cancer Mother    Depression Father    Drug abuse Father    Early death Father    Prostate cancer Father        prostate    Hypertension Sister    Hypertension Brother    Schizophrenia Brother    Diabetes Brother    Heart disease Maternal Grandmother    Scoliosis Daughter    Colon cancer Neg Hx    Colon polyps Neg Hx    Social History   Social History Narrative   Not on file    Allergies as of 09/11/2023       Reactions   Macrodantin [nitrofurantoin Macrocrystal] Swelling   Face swells   Sulfa Antibiotics Swelling   microdantin   Latex Itching, Rash   Band aids        Medication List        Accurate as of September 11, 2023  4:06 PM. If you have any questions, ask your nurse or doctor.          atorvastatin  20 MG tablet Commonly known as: LIPITOR Take 1 tablet (20 mg total) by mouth  at bedtime.   B-12 5000 MCG Caps Take by mouth.   baclofen  10 MG tablet Commonly known as: LIORESAL  Take  1 tablet (10 mg total) by mouth 2 (two) times daily.   buPROPion  300 MG 24 hr tablet Commonly known as: WELLBUTRIN  XL Take 1 tablet (300 mg total) by mouth daily.   busPIRone  15 MG tablet Commonly known as: BUSPAR  Take 1 tablet (15 mg total) by mouth as needed.   cetirizine  10 MG tablet Commonly known as: ZyrTEC  Allergy Take 1 tablet (10 mg total) by mouth daily.   D3 PO Take by mouth.   magnesium oxide 400 (240 Mg) MG tablet Commonly known as: MAG-OX Take 400 mg by mouth daily.   meclizine 25 MG tablet Commonly known as: ANTIVERT Take 25 mg by mouth 2 (two) times daily as needed.   meloxicam  15 MG tablet Commonly known as: MOBIC  Take 1 tablet (15 mg total) by mouth as needed for pain.   ondansetron  4 MG tablet Commonly known as: ZOFRAN  Take 1 tablet (4 mg total) by mouth every 8 (eight) hours as needed for nausea or vomiting.   pantoprazole  40 MG tablet Commonly known as: PROTONIX  Take 1 tablet (40 mg total) by mouth daily.   rosuvastatin  5 MG tablet Commonly known as: CRESTOR  Take 1 tablet (5 mg total) by mouth at bedtime.   tirzepatide  7.5 MG/0.5ML Pen Commonly known as: MOUNJARO  Inject 7.5 mg into the skin once a week.   traMADol  50 MG tablet Commonly known as: ULTRAM  Take 1 tablet (50 mg total) by mouth 3 (three) times daily as needed. For pain   valsartan  160 MG tablet Commonly known as: DIOVAN  Take 1 tablet (160 mg total) by mouth daily.        All past medical history, surgical history, allergies, family history, immunizations andmedications were updated in the EMR today and reviewed under the history and medication portions of their EMR.      No results found.   ROS 14 pt review of systems performed and negative (unless mentioned in an HPI)  Objective: BP 129/83   Pulse 76   Temp 98.3 F (36.8 C)   Wt 168 lb 9.6 oz (76.5 kg)    SpO2 100%   BMI 30.84 kg/m  Physical Exam Vitals and nursing note reviewed.  Constitutional:      General: She is not in acute distress.    Appearance: Normal appearance. She is not ill-appearing, toxic-appearing or diaphoretic.  HENT:     Head: Normocephalic and atraumatic.  Eyes:     General: No scleral icterus.       Right eye: No discharge.        Left eye: No discharge.     Extraocular Movements: Extraocular movements intact.     Conjunctiva/sclera: Conjunctivae normal.     Pupils: Pupils are equal, round, and reactive to light.  Cardiovascular:     Rate and Rhythm: Normal rate and regular rhythm.     Heart sounds: No murmur heard. Pulmonary:     Effort: Pulmonary effort is normal. No respiratory distress.     Breath sounds: Normal breath sounds. No wheezing, rhonchi or rales.  Musculoskeletal:     Cervical back: Neck supple.     Right lower leg: No edema.     Left lower leg: No edema.  Skin:    General: Skin is warm.     Findings: No rash.  Neurological:     Mental Status: She is alert and oriented to person, place, and time. Mental status is at baseline.     Motor: No weakness.     Gait:  Gait normal.  Psychiatric:        Mood and Affect: Mood normal.        Behavior: Behavior normal.        Thought Content: Thought content normal.        Judgment: Judgment normal.      No results found for this or any previous visit (from the past 48 hours).     Assessment/plan: YEVA BISSETTE is a 58 y.o. female present for  chronic condition management Diabetes doing great!  Increase to Mounjaro  7.5 mg weekly injection for (she will start this tomorrow).  Elected to stay on this dose until seen in late September.  Hopefully the Mounjaro  7.5 mg weekly injection will get her fault, which she is very close to obtaining - Hemoglobin A1c 6.6 > 5.3 > 5.5  today PNA series: completed today 05/23/2023 Flu shot: Declined (recommneded yearly) GFR-UTD 01/2023 Foot exam:  Completed today 05/23/2023 Eye exam: Discussed yearly ophthalmology exams for diabetes-[facility]> requested-still have not received these records patient is going to stop by her eye doctor and have this started. Microalbumin collected today 05/23/2023 Has appointment in September  No orders of the defined types were placed in this encounter.  No orders of the defined types were placed in this encounter.  Referral Orders  No referral(s) requested today      Note is dictated utilizing voice recognition software. Although note has been proof read prior to signing, occasional typographical errors still can be missed. If any questions arise, please do not hesitate to call for verification.  Electronically signed by: Charlies Bellini, DO [facility] [place]

## 2023-10-25 ENCOUNTER — Ambulatory Visit (INDEPENDENT_AMBULATORY_CARE_PROVIDER_SITE_OTHER): Admitting: Family Medicine

## 2023-10-25 ENCOUNTER — Encounter: Payer: Self-pay | Admitting: Family Medicine

## 2023-10-25 VITALS — BP 130/70 | HR 80 | Temp 98.0°F | Wt 168.2 lb

## 2023-10-25 DIAGNOSIS — M79606 Pain in leg, unspecified: Secondary | ICD-10-CM

## 2023-10-25 DIAGNOSIS — R6889 Other general symptoms and signs: Secondary | ICD-10-CM | POA: Diagnosis not present

## 2023-10-25 DIAGNOSIS — R61 Generalized hyperhidrosis: Secondary | ICD-10-CM | POA: Diagnosis not present

## 2023-10-25 DIAGNOSIS — M79659 Pain in unspecified thigh: Secondary | ICD-10-CM | POA: Diagnosis not present

## 2023-10-25 DIAGNOSIS — R209 Unspecified disturbances of skin sensation: Secondary | ICD-10-CM

## 2023-10-25 LAB — CBC WITH DIFFERENTIAL/PLATELET
Basophils Absolute: 0 K/uL (ref 0.0–0.1)
Basophils Relative: 0.8 % (ref 0.0–3.0)
Eosinophils Absolute: 0 K/uL (ref 0.0–0.7)
Eosinophils Relative: 0.7 % (ref 0.0–5.0)
HCT: 42.8 % (ref 36.0–46.0)
Hemoglobin: 14.2 g/dL (ref 12.0–15.0)
Lymphocytes Relative: 50.1 % — ABNORMAL HIGH (ref 12.0–46.0)
Lymphs Abs: 2.9 K/uL (ref 0.7–4.0)
MCHC: 33.1 g/dL (ref 30.0–36.0)
MCV: 88.7 fl (ref 78.0–100.0)
Monocytes Absolute: 0.4 K/uL (ref 0.1–1.0)
Monocytes Relative: 6.5 % (ref 3.0–12.0)
Neutro Abs: 2.4 K/uL (ref 1.4–7.7)
Neutrophils Relative %: 41.9 % — ABNORMAL LOW (ref 43.0–77.0)
Platelets: 335 K/uL (ref 150.0–400.0)
RBC: 4.83 Mil/uL (ref 3.87–5.11)
RDW: 13 % (ref 11.5–15.5)
WBC: 5.8 K/uL (ref 4.0–10.5)

## 2023-10-25 LAB — T4, FREE: Free T4: 0.66 ng/dL (ref 0.60–1.60)

## 2023-10-25 LAB — IBC + FERRITIN
Ferritin: 259 ng/mL (ref 10.0–291.0)
Iron: 100 ug/dL (ref 42–145)
Saturation Ratios: 26.8 % (ref 20.0–50.0)
TIBC: 373.8 ug/dL (ref 250.0–450.0)
Transferrin: 267 mg/dL (ref 212.0–360.0)

## 2023-10-25 LAB — TSH: TSH: 3.5 u[IU]/mL (ref 0.35–5.50)

## 2023-10-25 NOTE — Progress Notes (Signed)
 Sandy Waters , 1965/05/30, 58 y.o., female MRN: [id number] Patient Care Team    Relationship Specialty Notifications Start End  Catherine Charlies LABOR, DO PCP - General Family Medicine  12/06/22   Harvey Margo CROME, MD (Inactive) Consulting Physician Gastroenterology  05/17/17   May Commander, MD Referring Physician Obstetrics and Gynecology  12/06/22   Joshua Rush, OD  Optometry  12/06/22   Ezzard Sonny RAMAN, PA-C Physician Assistant Gastroenterology  12/06/22    Comment: [facility]    Chief Complaint  Patient presents with   fluctuating temperture     Subjective: Sandy Waters is a 58 y.o. Pt presents for an OV with complaints of temperature fluctuations worsening over the last few months.  She reports she has noticed that her feet are staying cold consistently and it is requiring her to wear socks even when it is warm outside.  She states she would find herself needing to add close or blanket, then she will also then break out in a sweat overall whole body.  The process occurs back-and-forth throughout the majority of the day.  She has noticed the back top portion of her legs on both sides for a week when walking.  She states they do hurt at times even without walking, but when she starts activity the ache becomes more.  She has noticed a color change in her feet when she feels they are cold.        09/11/2023    1:31 PM 05/23/2023    1:07 PM 02/13/2023    1:53 PM 12/06/2022    1:13 PM  Depression screen PHQ 2/9  Decreased Interest 0 0 1 0  Down, Depressed, Hopeless 0 0 0 0  PHQ - 2 Score 0 0 1 0  Altered sleeping 0 0  1  Tired, decreased energy 0 0  1  Change in appetite 0 0  1  Feeling bad or failure about yourself  0 0  0  Trouble concentrating 0 0  0  Moving slowly or fidgety/restless 0 0  0  Suicidal thoughts 0 0  0  PHQ-9 Score 0 0  3  Difficult doing work/chores Not difficult at all Not difficult at all  Not difficult at all    Allergies  Allergen  Reactions   Macrodantin [Nitrofurantoin Macrocrystal] Swelling    Face swells   Sulfa Antibiotics Swelling    microdantin   Latex Itching and Rash    Band aids   Social History   Social History Narrative   Not on file   Past Medical History:  Diagnosis Date   Acid reflux    Anxiety    Arthritis    Carpal tunnel syndrome, left upper limb 09/21/2017   She had a NCV with EMG performed on 04/06/17.  The study revealed a borderline to very mild median nerve entrapment at the left wrist.      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   Depression    Fibromyalgia    2012   Frequent headaches    Hiatal hernia 10/14/2010   History of cholecystectomy 07/10/2014   History of stomach ulcers 09/21/2017   Hypertension    IBS (irritable bowel syndrome) 2019   Migraines 2019   Non-alcoholic fatty liver disease 04/2017   Other insomnia 09/21/2017   Thyroid  disease    Past Surgical History:  Procedure Laterality Date  CESAREAN SECTION  1986   1986   CHOLECYSTECTOMY  2010   2010   COLONOSCOPY  2014   ?2014 AND REPORTEDLY NORMAL   COLONOSCOPY  05/2006   Dr. Morton D'Oro: few scattered ulcerations in lower rectum likely related to prep, toruous sigmoid colon difficult to pass, few diverticula in sigmoid colon.    ESOPHAGOGASTRODUODENOSCOPY  11/08/2013   Dr. Garlan: normal. biopsies from proximal and distal esophagus with no increased intraepithelial eosinophils, no intestinal metaplasia. gastric bx with chronic active gastritis, no H.pylori   ESOPHAGOGASTRODUODENOSCOPY  09/2010   Dr. Lauraine Denver: bile reflux, small hiatal hernia   ESOPHAGOGASTRODUODENOSCOPY  02/2005   Dr. Morton D'Oro: gastritis mostly in the antrum, superficial ulcerations in the stomach, mild duodenitis, hiatal hernia. gastric bx negative.   SHOULDER SURGERY Right 08/30/2016   bone spur/scar tissue removed    TONSILLECTOMY  1984   1984   Family History  Problem Relation  Age of Onset   Heart disease Mother    Hypertension Mother    Diabetes Mother    Breast cancer Mother    Depression Father    Drug abuse Father    Early death Father    Prostate cancer Father        prostate    Hypertension Sister    Hypertension Brother    Schizophrenia Brother    Diabetes Brother    Heart disease Maternal Grandmother    Scoliosis Daughter    Colon cancer Neg Hx    Colon polyps Neg Hx    Allergies as of 10/25/2023       Reactions   Macrodantin [nitrofurantoin Macrocrystal] Swelling   Face swells   Sulfa Antibiotics Swelling   microdantin   Latex Itching, Rash   Band aids        Medication List        Accurate as of October 25, 2023  1:11 PM. If you have any questions, ask your nurse or doctor.          atorvastatin  20 MG tablet Commonly known as: LIPITOR Take 1 tablet (20 mg total) by mouth at bedtime.   B-12 5000 MCG Caps Take by mouth.   baclofen  10 MG tablet Commonly known as: LIORESAL  Take 1 tablet (10 mg total) by mouth 2 (two) times daily.   buPROPion  300 MG 24 hr tablet Commonly known as: WELLBUTRIN  XL Take 1 tablet (300 mg total) by mouth daily.   busPIRone  15 MG tablet Commonly known as: BUSPAR  Take 1 tablet (15 mg total) by mouth as needed.   cetirizine  10 MG tablet Commonly known as: ZyrTEC  Allergy Take 1 tablet (10 mg total) by mouth daily.   D3 PO Take by mouth.   magnesium oxide 400 (240 Mg) MG tablet Commonly known as: MAG-OX Take 400 mg by mouth daily.   meclizine 25 MG tablet Commonly known as: ANTIVERT Take 25 mg by mouth 2 (two) times daily as needed.   meloxicam  15 MG tablet Commonly known as: MOBIC  Take 1 tablet (15 mg total) by mouth as needed for pain.   ondansetron  4 MG tablet Commonly known as: ZOFRAN  Take 1 tablet (4 mg total) by mouth every 8 (eight) hours as needed for nausea or vomiting.   pantoprazole  40 MG tablet Commonly known as: PROTONIX  Take 1 tablet (40 mg total) by mouth daily.    rosuvastatin  5 MG tablet Commonly known as: CRESTOR  Take 1 tablet (5 mg total) by mouth at bedtime.   tirzepatide  7.5 MG/0.5ML  Pen Commonly known as: MOUNJARO  Inject 7.5 mg into the skin once a week.   traMADol  50 MG tablet Commonly known as: ULTRAM  Take 1 tablet (50 mg total) by mouth 3 (three) times daily as needed. For pain   valsartan  160 MG tablet Commonly known as: DIOVAN  Take 1 tablet (160 mg total) by mouth daily.        All past medical history, surgical history, allergies, family history, immunizations andmedications were updated in the EMR today and reviewed under the history and medication portions of their EMR.     ROS Negative, with the exception of above mentioned in HPI   Objective:  BP 130/70   Pulse 80   Temp 98 F (36.7 C)   Wt 168 lb 3.2 oz (76.3 kg)   SpO2 100%   BMI 30.76 kg/m  Body mass index is 30.76 kg/m. Physical Exam Vitals and nursing note reviewed.  Constitutional:      General: She is not in acute distress.    Appearance: Normal appearance. She is not ill-appearing, toxic-appearing or diaphoretic.  HENT:     Head: Normocephalic and atraumatic.  Eyes:     General: No scleral icterus.       Right eye: No discharge.        Left eye: No discharge.     Extraocular Movements: Extraocular movements intact.     Conjunctiva/sclera: Conjunctivae normal.     Pupils: Pupils are equal, round, and reactive to light.  Cardiovascular:     Rate and Rhythm: Normal rate and regular rhythm.     Pulses:          Posterior tibial pulses are 1+ on the right side and 1+ on the left side.     Heart sounds: No murmur heard. Pulmonary:     Effort: Pulmonary effort is normal. No respiratory distress.     Breath sounds: Normal breath sounds. No wheezing, rhonchi or rales.  Musculoskeletal:     Right lower leg: No edema.     Left lower leg: No edema.  Feet:     Right foot:     Skin integrity: Skin integrity normal. No skin breakdown or erythema.      Left foot:     Skin integrity: Skin integrity normal. No skin breakdown or erythema.  Skin:    General: Skin is warm.     Coloration: Skin is not pale.     Findings: No erythema or rash.  Neurological:     Mental Status: She is alert and oriented to person, place, and time. Mental status is at baseline.     Motor: No weakness.     Gait: Gait normal.  Psychiatric:        Mood and Affect: Mood normal.        Behavior: Behavior normal.        Thought Content: Thought content normal.        Judgment: Judgment normal.      No results found. No results found. No results found for this or any previous visit (from the past 24 hours).  Assessment/Plan: Sandy Waters is a 58 y.o. female present for OV for  Cold intolerance (Primary)/temperature intolerance/fluctuations/excessive sweating We discussed differential diagnosis of anemia, thyroid  disorders, iron deficiency, parathyroid disorders, autoimmune disorders as some potential causes.  We also discussed fluctuations can be normal to some degree with the aging process. She may be describing some claudication symptoms with her posterior thighs becoming achy with walking.  Will obtain vascular studies of her lower extremities. - CBC w/Diff - TSH - T4, free - IBC + Ferritin - PTH, Intact and Calcium  - ANA, IFA Comprehensive Panel-([facility]) - Rheumatoid Factor - Cyclic citrul peptide antibody, IgG ([facility])  Cold and painful lower extremities: Referral to vascular vein for vascular studies  Reviewed expectations re: course of current medical issues. Discussed self-management of symptoms. Outlined signs and symptoms indicating need for more acute intervention. Patient verbalized understanding and all questions were answered. Patient received an After-Visit Summary.    No orders of the defined types were placed in this encounter.  No orders of the defined types were placed in this encounter.  Referral Orders  No referral(s)  requested today     Note is dictated utilizing voice recognition software. Although note has been proof read prior to signing, occasional typographical errors still can be missed. If any questions arise, please do not hesitate to call for verification.   electronically signed by:  Charlies Bellini, DO  [facility] [place]

## 2023-10-26 ENCOUNTER — Ambulatory Visit: Payer: Self-pay | Admitting: Family Medicine

## 2023-10-28 LAB — ANTI-NUCLEAR AB-TITER (ANA TITER)
ANA TITER: 1:80 {titer} — ABNORMAL HIGH
ANA Titer 1: 1:40 {titer} — ABNORMAL HIGH

## 2023-10-28 LAB — ANA, IFA COMPREHENSIVE PANEL
Anti Nuclear Antibody (ANA): POSITIVE — AB
ENA SM Ab Ser-aCnc: <1 AI
SM/RNP: <1 AI
SSA (Ro) (ENA) Antibody, IgG: <1 AI
SSB (La) (ENA) Antibody, IgG: <1 AI
Scleroderma (Scl-70) (ENA) Antibody, IgG: <1 AI
ds DNA Ab: 1 [IU]/mL

## 2023-10-28 LAB — PTH, INTACT AND CALCIUM
Calcium: 9.7 mg/dL (ref 8.6–10.4)
PTH: 24 pg/mL (ref 16–77)

## 2023-10-28 LAB — RHEUMATOID FACTOR: Rheumatoid fact SerPl-aCnc: <10 [IU]/mL (ref <14)

## 2023-10-28 LAB — CYCLIC CITRUL PEPTIDE ANTIBODY, IGG: Cyclic Citrullin Peptide Ab: <16 U

## 2023-11-02 ENCOUNTER — Telehealth: Payer: Self-pay

## 2023-11-02 NOTE — Telephone Encounter (Signed)
 Communication  Reason for CRM: Patient referral was sent to a vascular and vein location  in [place].Patient would like to know if she could ave referral sent to a location in [place] ,that's closer to her home?   Please assist if possible.

## 2023-11-03 NOTE — Telephone Encounter (Signed)
 No further action needed at this time.

## 2023-11-08 ENCOUNTER — Other Ambulatory Visit: Payer: Self-pay | Admitting: Vascular Surgery

## 2023-11-08 DIAGNOSIS — M79606 Pain in leg, unspecified: Secondary | ICD-10-CM

## 2023-11-17 ENCOUNTER — Ambulatory Visit: Admitting: Vascular Surgery

## 2023-11-17 ENCOUNTER — Encounter: Payer: Self-pay | Admitting: Vascular Surgery

## 2023-11-17 ENCOUNTER — Ambulatory Visit (HOSPITAL_COMMUNITY)
Admission: RE | Admit: 2023-11-17 | Discharge: 2023-11-17 | Disposition: A | Discharge status: Home / Self Care | Source: Ambulatory Visit | Attending: Vascular Surgery | Admitting: Vascular Surgery

## 2023-11-17 VITALS — BP 130/91 | HR 63 | Temp 97.6°F | Resp 16 | Ht 62.0 in | Wt 166.4 lb

## 2023-11-17 DIAGNOSIS — M79606 Pain in leg, unspecified: Secondary | ICD-10-CM | POA: Insufficient documentation

## 2023-11-17 DIAGNOSIS — R209 Unspecified disturbances of skin sensation: Secondary | ICD-10-CM

## 2023-11-17 LAB — VAS US ABI WITH/WO TBI
Left ABI: 1.09
Right ABI: 1.15

## 2023-11-17 NOTE — Progress Notes (Signed)
 Office Note     CC: Cold feet Requesting Provider:  Catherine Charlies LABOR, DO  HPI: Sandy Waters is a 58 y.o. ([birth year]) female presenting at the request of .[name], [name], DO due to concern for peripheral arterial disease with Cold lower extremities.  On exam, [name] was doing well.  A native of [place] , she recently moved to [place] after her divorce.  She is not a fan of the traffic, and is looking for somewhere that is a little slower paced.  Carmie has appreciated this sensation of cold feet for quite some time.  The severity waxes and wanes.  She denies sensory or motor deficits.  She is a non-smoker.  Recently diagnosed with diabetes in the last year.  Denies symptoms of claudications, ischemic rest pain, tissue loss.  No significant caffeine intake, does not use amphetamines such as Adderall.  No color changes.  Past Medical History:  Diagnosis Date   Acid reflux    Anxiety    Arthritis    Carpal tunnel syndrome, left upper limb 09/21/2017   She had a NCV with EMG performed on 04/06/17.  The study revealed a borderline to very mild median nerve entrapment at the left wrist.      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   Depression    Fibromyalgia    2012   Frequent headaches    Hiatal hernia 10/14/2010   History of cholecystectomy 07/10/2014   History of stomach ulcers 09/21/2017   Hypertension    IBS (irritable bowel syndrome) 2019   Migraines 2019   Non-alcoholic fatty liver disease 04/2017   Other insomnia 09/21/2017   Thyroid  disease     Past Surgical History:  Procedure Laterality Date   CESAREAN SECTION  1986   1986   CHOLECYSTECTOMY  2010   2010   COLONOSCOPY  2014   ?2014 AND REPORTEDLY NORMAL   COLONOSCOPY  05/2006   Dr. Morton D'Oro: few scattered ulcerations in lower rectum likely related to prep, toruous sigmoid colon difficult to pass, few diverticula in sigmoid colon.     ESOPHAGOGASTRODUODENOSCOPY  11/08/2013   Dr. Garlan: normal. biopsies from proximal and distal esophagus with no increased intraepithelial eosinophils, no intestinal metaplasia. gastric bx with chronic active gastritis, no H.pylori   ESOPHAGOGASTRODUODENOSCOPY  09/2010   Dr. Lauraine Denver: bile reflux, small hiatal hernia   ESOPHAGOGASTRODUODENOSCOPY  02/2005   Dr. Morton D'Oro: gastritis mostly in the antrum, superficial ulcerations in the stomach, mild duodenitis, hiatal hernia. gastric bx negative.   SHOULDER SURGERY Right 08/30/2016   bone spur/scar tissue removed    TONSILLECTOMY  1984   1984    Social History   Socioeconomic History   Marital status: Divorced    Spouse name: Not on file   Number of children: Not on file   Years of education: Not on file   Highest education level: Some college, no degree  Occupational History   Not on file  Tobacco Use   Smoking status: Former    Current packs/day: 0.00    Types: Cigarettes    Quit date: 02/28/1993    Years since quitting: 30.7   Smokeless tobacco: Never  Vaping Use   Vaping status: Never Used  Substance and Sexual Activity   Alcohol  use: Yes    Comment: occ   Drug use: Never   Sexual activity: Not Currently  Other Topics Concern   Not on  file  Social History Narrative   Not on file   Social Drivers of Health   Financial Resource Strain: Low Risk  (08/22/2023)   Overall Financial Resource Strain (CARDIA)    Difficulty of Paying Living Expenses: Not very hard  Food Insecurity: No Food Insecurity (08/22/2023)   Hunger Vital Sign    Worried About Running Out of Food in the Last Year: Never true    Ran Out of Food in the Last Year: Never true  Transportation Needs: No Transportation Needs (08/22/2023)   PRAPARE - Administrator, Civil Service (Medical): No    Lack of Transportation (Non-Medical): No  Physical Activity: Insufficiently Active (08/22/2023)   Exercise Vital Sign    Days of Exercise per Week: 2  days    Minutes of Exercise per Session: 20 min  Stress: No Stress Concern Present (08/22/2023)   [employer] of Occupational Health - Occupational Stress Questionnaire    Feeling of Stress: Not at all  Social Connections: Socially Isolated (08/22/2023)   Social Connection and Isolation Panel    Frequency of Communication with Friends and Family: More than three times a week    Frequency of Social Gatherings with Friends and Family: Once a week    Attends Religious Services: Patient declined    Database administrator or Organizations: No    Attends Engineer, structural: Not on file    Marital Status: Divorced  Catering manager Violence: Not on file   Family History  Problem Relation Age of Onset   Heart disease Mother    Hypertension Mother    Diabetes Mother    Breast cancer Mother    Depression Father    Drug abuse Father    Early death Father    Prostate cancer Father        prostate    Hypertension Sister    Hypertension Brother    Schizophrenia Brother    Diabetes Brother    Heart disease Maternal Grandmother    Scoliosis Daughter    Colon cancer Neg Hx    Colon polyps Neg Hx     Current Outpatient Medications  Medication Sig Dispense Refill   atorvastatin  (LIPITOR) 20 MG tablet Take 1 tablet (20 mg total) by mouth at bedtime. 90 tablet 3   baclofen  (LIORESAL ) 10 MG tablet Take 1 tablet (10 mg total) by mouth 2 (two) times daily. 180 each 1   buPROPion  (WELLBUTRIN  XL) 300 MG 24 hr tablet Take 1 tablet (300 mg total) by mouth daily. 90 tablet 1   busPIRone  (BUSPAR ) 15 MG tablet Take 1 tablet (15 mg total) by mouth as needed. 90 tablet 1   cetirizine  (ZYRTEC  ALLERGY) 10 MG tablet Take 1 tablet (10 mg total) by mouth daily. 30 tablet 0   Cholecalciferol (D3 PO) Take by mouth.     Cyanocobalamin  (B-12) 5000 MCG CAPS Take by mouth.     magnesium oxide (MAG-OX) 400 (240 Mg) MG tablet Take 400 mg by mouth daily.     meclizine (ANTIVERT) 25 MG tablet Take 25 mg  by mouth 2 (two) times daily as needed.     ondansetron  (ZOFRAN ) 4 MG tablet Take 1 tablet (4 mg total) by mouth every 8 (eight) hours as needed for nausea or vomiting. 20 tablet 0   pantoprazole  (PROTONIX ) 40 MG tablet Take 1 tablet (40 mg total) by mouth daily. 90 tablet 1   rosuvastatin  (CRESTOR ) 5 MG tablet Take 1 tablet (5 mg total)  by mouth at bedtime. 90 tablet 1   tirzepatide  (MOUNJARO ) 7.5 MG/0.5ML Pen Inject 7.5 mg into the skin once a week. 2 mL 5   traMADol  (ULTRAM ) 50 MG tablet Take 1 tablet (50 mg total) by mouth 3 (three) times daily as needed. For pain 90 tablet 5   valsartan  (DIOVAN ) 160 MG tablet Take 1 tablet (160 mg total) by mouth daily. 90 tablet 1   No current facility-administered medications for this visit.    Allergies  Allergen Reactions   Macrodantin [Nitrofurantoin Macrocrystal] Swelling    Face swells   Sulfa Antibiotics Swelling    microdantin   Latex Itching and Rash    Band aids     REVIEW OF SYSTEMS:  [X]  denotes positive finding, [ ]  denotes negative finding Cardiac  Comments:  Chest pain or chest pressure:    Shortness of breath upon exertion:    Short of breath when lying flat:    Irregular heart rhythm:        Vascular    Pain in calf, thigh, or hip brought on by ambulation:    Pain in feet at night that wakes you up from your sleep:     Blood clot in your veins:    Leg swelling:         Pulmonary    Oxygen at home:    Productive cough:     Wheezing:         Neurologic    Sudden weakness in arms or legs:     Sudden numbness in arms or legs:     Sudden onset of difficulty speaking or slurred speech:    Temporary loss of vision in one eye:     Problems with dizziness:         Gastrointestinal    Blood in stool:     Vomited blood:         Genitourinary    Burning when urinating:     Blood in urine:        Psychiatric    Major depression:         Hematologic    Bleeding problems:    Problems with blood clotting too easily:         Skin    Rashes or ulcers:        Constitutional    Fever or chills:      PHYSICAL EXAMINATION:  Vitals:   11/17/23 1323  BP: (!) 130/91  Pulse: 63  Resp: 16  Temp: 97.6 F (36.4 C)  TempSrc: Temporal  SpO2: 100%  Weight: 166 lb 6.4 oz (75.5 kg)  Height: 5' 2 (1.575 m)    General:  WDWN in NAD; vital signs documented above Gait: Not observed HENT: WNL, normocephalic Pulmonary: normal non-labored breathing , without wheezing Cardiac: regular HR Abdomen: soft, NT, no masses Skin: without rashes Vascular Exam/Pulses:  Right Left  Radial 2+ (normal) 2+ (normal)  Ulnar    Femoral    Popliteal    DP 2+ (normal) 2+ (normal)  PT     Extremities: without ischemic changes, without Gangrene , without cellulitis; without open wounds;  Musculoskeletal: no muscle wasting or atrophy  Neurologic: A&O X 3;  No focal weakness or paresthesias are detected Psychiatric:  The pt has Normal affect.   Non-Invasive Vascular Imaging:     ABI Findings:  +---------+------------------+-----+---------+--------+  Right   Rt Pressure (mmHg)IndexWaveform Comment   +---------+------------------+-----+---------+--------+  Brachial 137                                       +---------+------------------+-----+---------+--------+  PTA     158               1.15 triphasic          +---------+------------------+-----+---------+--------+  DP      154               1.12 triphasic          +---------+------------------+-----+---------+--------+  Great Toe148               1.08 Normal             +---------+------------------+-----+---------+--------+   +---------+------------------+-----+---------+-------+  Left    Lt Pressure (mmHg)IndexWaveform Comment  +---------+------------------+-----+---------+-------+  Brachial 133                                      +---------+------------------+-----+---------+-------+  PTA     149                1.09 triphasic         +---------+------------------+-----+---------+-------+  DP      149               1.09 triphasic         +---------+------------------+-----+---------+-------+  Great Toe131               0.96 Normal            +---------+------------------+-----+---------+-------+     ASSESSMENT/PLAN: GENAVIEVE MANGIAPANE is a 58 y.o. female presenting with sensation of cold feet bilaterally.  On physical exam, she has easily palpable pulses bilaterally. ABI reviewed, the study is normal.  Patient does not have peripheral arterial disease. I do not have an etiology for her lower extremity cool sensation.  No signs or symptoms consistent with Raynaud's.   Fonda FORBES Rim, MD [facility] [phone]

## 2023-11-24 ENCOUNTER — Ambulatory Visit: Admitting: Family Medicine

## 2023-11-24 ENCOUNTER — Encounter: Payer: Self-pay | Admitting: Family Medicine

## 2023-11-24 VITALS — BP 128/76 | HR 68 | Temp 98.1°F | Wt 164.0 lb

## 2023-11-24 DIAGNOSIS — I10 Essential (primary) hypertension: Secondary | ICD-10-CM | POA: Diagnosis not present

## 2023-11-24 DIAGNOSIS — E785 Hyperlipidemia, unspecified: Secondary | ICD-10-CM | POA: Diagnosis not present

## 2023-11-24 DIAGNOSIS — E1169 Type 2 diabetes mellitus with other specified complication: Secondary | ICD-10-CM | POA: Diagnosis not present

## 2023-11-24 DIAGNOSIS — F419 Anxiety disorder, unspecified: Secondary | ICD-10-CM

## 2023-11-24 DIAGNOSIS — E782 Mixed hyperlipidemia: Secondary | ICD-10-CM

## 2023-11-24 DIAGNOSIS — F32A Depression, unspecified: Secondary | ICD-10-CM

## 2023-11-24 DIAGNOSIS — Z23 Encounter for immunization: Secondary | ICD-10-CM

## 2023-11-24 DIAGNOSIS — K219 Gastro-esophageal reflux disease without esophagitis: Secondary | ICD-10-CM

## 2023-11-24 DIAGNOSIS — Z7985 Long-term (current) use of injectable non-insulin antidiabetic drugs: Secondary | ICD-10-CM

## 2023-11-24 LAB — POCT GLYCOSYLATED HEMOGLOBIN (HGB A1C)
HbA1c POC (<> result, manual entry): 5.2 % (ref 4.0–5.6)
HbA1c, POC (controlled diabetic range): 5.2 % (ref 0.0–7.0)
HbA1c, POC (prediabetic range): 5.2 % — AB (ref 5.7–6.4)
Hemoglobin A1C: 5.2 % (ref 4.0–5.6)

## 2023-11-24 MED ORDER — BACLOFEN 10 MG PO TABS: 10.0000 mg | ORAL_TABLET | Freq: Two times a day (BID) | ORAL | 1 refills | Status: AC

## 2023-11-24 MED ORDER — VENLAFAXINE HCL ER 37.5 MG PO CP24: 37.5000 mg | ORAL_CAPSULE | Freq: Every day | ORAL | 1 refills | Status: AC

## 2023-11-24 MED ORDER — BUPROPION HCL ER (XL) 150 MG PO TB24: 150.0000 mg | ORAL_TABLET | Freq: Every day | ORAL | 1 refills | Status: AC

## 2023-11-24 MED ORDER — BUSPIRONE HCL 15 MG PO TABS: 15.0000 mg | ORAL_TABLET | ORAL | 1 refills | Status: DC | PRN

## 2023-11-24 MED ORDER — TIRZEPATIDE 7.5 MG/0.5ML ~~LOC~~ SOAJ: 7.5000 mg | SUBCUTANEOUS | 5 refills | Status: DC

## 2023-11-24 MED ORDER — BUPROPION HCL ER (XL) 300 MG PO TB24: 300.0000 mg | ORAL_TABLET | Freq: Every day | ORAL | 1 refills | Status: DC

## 2023-11-24 MED ORDER — TRAMADOL HCL 50 MG PO TABS: 50.0000 mg | ORAL_TABLET | Freq: Three times a day (TID) | ORAL | 5 refills | Status: AC | PRN

## 2023-11-24 MED ORDER — VALSARTAN 160 MG PO TABS: 160.0000 mg | ORAL_TABLET | Freq: Every day | ORAL | 1 refills | Status: AC

## 2023-11-24 MED ORDER — ONDANSETRON HCL 4 MG PO TABS: 4.0000 mg | ORAL_TABLET | Freq: Three times a day (TID) | ORAL | 1 refills | Status: AC | PRN

## 2023-11-24 MED ORDER — ROSUVASTATIN CALCIUM 5 MG PO TABS: 5.0000 mg | ORAL_TABLET | Freq: Every day | ORAL | 1 refills | Status: AC

## 2023-11-24 MED ORDER — PANTOPRAZOLE SODIUM 40 MG PO TBEC: 40.0000 mg | DELAYED_RELEASE_TABLET | Freq: Every day | ORAL | 1 refills | Status: AC

## 2023-11-24 NOTE — Patient Instructions (Addendum)
 Vitals - 1 value per visit     Weight (lb) Height BMI  05/25/2023 190  5' 2 (1.575 m)  34.75 kg/m2   06/22/2023 183  5' 2 (1.575 m)  33.47 kg/m2   07/17/2023     08/23/2023 169   30.91 kg/m2   09/11/2023 168.6   30.84 kg/m2   10/25/2023 168.2   30.76 kg/m2   11/17/2023 166.4  5' 2 (1.575 m)  30.43 kg/m2   11/24/2023 164   30 kg/m2    Return in about 4 months (around 03/11/2024) for cpe (20 min), Routine chronic condition follow-up.        Great to see you today.  I have refilled the medication(s) we provide.   If labs were collected or images ordered, we will inform you of  results once we have received them and reviewed. We will contact you either by echart message, or telephone call.  Please give ample time to the testing facility, and our office to run,  receive and review results. Please do not call inquiring of results, even if you can see them in your chart. We will contact you as soon as we are able. If it has been over 1 week since the test was completed, and you have not yet heard from us , then please call us .    - echart message- for normal results that have been seen by the patient already.   - telephone call: abnormal results or if patient has not viewed results in their echart.  If a referral to a specialist was entered for you, please call us  in 2 weeks if you have not heard from the specialist office to schedule.

## 2023-11-24 NOTE — Progress Notes (Signed)
 Patient ID: Sandy Waters, female  DOB: 08-27-1965, 58 y.o.   MRN: [id number] Patient Care Team    Relationship Specialty Notifications Start End  Catherine Charlies LABOR, DO PCP - General Family Medicine  12/06/22   Harvey Margo CROME, MD (Inactive) Consulting Physician Gastroenterology  05/17/17   May Commander, MD Referring Physician Obstetrics and Gynecology  12/06/22   Joshua Rush, OD  Optometry  12/06/22   Ezzard Sonny RAMAN, PA-C Physician Assistant Gastroenterology  12/06/22    Comment: [facility]    Chief Complaint  Patient presents with   Diabetes    Chronic condition management Influenza vaccine-declined    Subjective: Sandy Waters is a 58 y.o.  female present for chronic condition management appointment All past medical history, surgical history, allergies, family history, immunizations, medications and social history were updated in the electronic medical record today. All recent labs, ED visits and hospitalizations within the last year were reviewed.  Diabetes type 2 Pt reports compliance with Mounjaro  7.5 mg weekly and is tolerating taper.  She does report some nausea associated with this dose. Patient denies dizziness, hyperglycemic or hypoglycemic events. Patient denies numbness, tingling in the extremities or nonhealing wounds of feet.  She reports her goal weight would be about 160 pounds. She has made dietary changes She is exercising  Vitals - 1 value per visit     Weight (lb) Height BMI  05/25/2023 190  5' 2 (1.575 m)  34.75 kg/m2   06/22/2023 183  5' 2 (1.575 m)  33.47 kg/m2   07/17/2023     08/23/2023 169   30.91 kg/m2   09/11/2023 168.6   30.84 kg/m2   10/25/2023 168.2   30.76 kg/m2   11/17/2023 166.4  5' 2 (1.575 m)  30.43 kg/m2   11/24/2023 164   30 kg/m2    Primary hypertension Pt reports compliance with Diovan  160 mg. Blood pressures ranges at home.  Patient denies chest pain, shortness of breath, dizziness or lower extremity edema.     Anxiety and depression Patient reports compliance Wellbutrin  300 mg daily.  She has been on this medication for some time.  She uses BuSpar  only when needed.  Fibromyalgia/Degeneration of intervertebral disc of lumbar region with discogenic back pain and lower extremity pain Patient reports she has been diagnosed with fibromyalgia around 2012.   She also experiences low back pain from her lumbar degenerative disc disease.  She uses baclofen  when needed and tramadol  approximately 1-2 times daily, 3 times daily with a flare.  Gastroesophageal reflux disease without esophagitis Patient reports she compliance with Protonix  40 mg daily.      09/11/2023    1:31 PM 05/23/2023    1:07 PM 02/13/2023    1:53 PM 12/06/2022    1:13 PM  Depression screen PHQ 2/9  Decreased Interest 0 0 1 0  Down, Depressed, Hopeless 0 0 0 0  PHQ - 2 Score 0 0 1 0  Altered sleeping 0 0  1  Tired, decreased energy 0 0  1  Change in appetite 0 0  1  Feeling bad or failure about yourself  0 0  0  Trouble concentrating 0 0  0  Moving slowly or fidgety/restless 0 0  0  Suicidal thoughts 0 0  0  PHQ-9 Score 0 0  3  Difficult doing work/chores Not difficult at all Not difficult at all  Not difficult at all      09/11/2023  1:31 PM 05/23/2023    1:07 PM 12/06/2022    1:13 PM  GAD 7 : Generalized Anxiety Score  Nervous, Anxious, on Edge 0 0 1  Control/stop worrying 0 0 0  Worry too much - different things 0 0 0  Trouble relaxing 0 0 0  Restless 0 0 0  Easily annoyed or irritable 0 0 0  Afraid - awful might happen 0 0 0  Total GAD 7 Score 0 0 1  Anxiety Difficulty Not difficult at all Not difficult at all Not difficult at all           11/24/2023    3:30 PM 09/11/2023    1:31 PM 05/23/2023    1:07 PM 02/13/2023    1:53 PM 12/06/2022    1:13 PM  Fall Risk   Falls in the past year? 0 0 0 0 0  Number falls in past yr: 0   0 0  Injury with Fall? 0   0 0  Risk for fall due to : No Fall Risks   No Fall  Risks No Fall Risks  Follow up Falls evaluation completed;Falls prevention discussed Falls evaluation completed Falls evaluation completed Falls evaluation completed Falls evaluation completed    Immunization History  Administered Date(s) Administered   Influenza, Seasonal, Injecte, Preservative Fre 01/03/2013   Influenza-Unspecified 12/18/2007   PNEUMOCOCCAL CONJUGATE-20 05/23/2023   Tdap 07/05/2011    No results found.  Past Medical History:  Diagnosis Date   Acid reflux    Anxiety    Arthritis    Carpal tunnel syndrome, left upper limb 09/21/2017   She had a NCV with EMG performed on 04/06/17.  The study revealed a borderline to very mild median nerve entrapment at the left wrist.      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   Depression    Fibromyalgia    2012   Frequent headaches    Hiatal hernia 10/14/2010   History of cholecystectomy 07/10/2014   History of stomach ulcers 09/21/2017   Hypertension    IBS (irritable bowel syndrome) 2019   Migraines 2019   Non-alcoholic fatty liver disease 04/2017   Other insomnia 09/21/2017   Thyroid  disease    Allergies  Allergen Reactions   Macrodantin [Nitrofurantoin Macrocrystal] Swelling    Face swells   Sulfa Antibiotics Swelling    microdantin   Latex Itching and Rash    Band aids   Past Surgical History:  Procedure Laterality Date   CESAREAN SECTION  1986   1986   CHOLECYSTECTOMY  2010   2010   COLONOSCOPY  2014   ?2014 AND REPORTEDLY NORMAL   COLONOSCOPY  05/2006   Dr. Morton D'Oro: few scattered ulcerations in lower rectum likely related to prep, toruous sigmoid colon difficult to pass, few diverticula in sigmoid colon.    ESOPHAGOGASTRODUODENOSCOPY  11/08/2013   Dr. Garlan: normal. biopsies from proximal and distal esophagus with no increased intraepithelial eosinophils, no intestinal metaplasia. gastric bx with chronic active gastritis, no H.pylori    ESOPHAGOGASTRODUODENOSCOPY  09/2010   Dr. Lauraine Denver: bile reflux, small hiatal hernia   ESOPHAGOGASTRODUODENOSCOPY  02/2005   Dr. Morton D'Oro: gastritis mostly in the antrum, superficial ulcerations in the stomach, mild duodenitis, hiatal hernia. gastric bx negative.   SHOULDER SURGERY Right 08/30/2016   bone spur/scar tissue removed    TONSILLECTOMY  1984   1984   Family History  Problem Relation Age  of Onset   Heart disease Mother    Hypertension Mother    Diabetes Mother    Breast cancer Mother    Depression Father    Drug abuse Father    Early death Father    Prostate cancer Father        prostate    Hypertension Sister    Hypertension Brother    Schizophrenia Brother    Diabetes Brother    Heart disease Maternal Grandmother    Scoliosis Daughter    Colon cancer Neg Hx    Colon polyps Neg Hx    Social History   Social History Narrative   Not on file    Allergies as of 11/24/2023       Reactions   Macrodantin [nitrofurantoin Macrocrystal] Swelling   Face swells   Sulfa Antibiotics Swelling   microdantin   Latex Itching, Rash   Band aids        Medication List        Accurate as of November 24, 2023  4:09 PM. If you have any questions, ask your nurse or doctor.          atorvastatin  20 MG tablet Commonly known as: LIPITOR Take 1 tablet (20 mg total) by mouth at bedtime.   B-12 5000 MCG Caps Take by mouth.   baclofen  10 MG tablet Commonly known as: LIORESAL  Take 1 tablet (10 mg total) by mouth 2 (two) times daily.   buPROPion  150 MG 24 hr tablet Commonly known as: WELLBUTRIN  XL Take 1 tablet (150 mg total) by mouth daily. What changed:  medication strength how much to take Changed by: Charlies Bellini   busPIRone  15 MG tablet Commonly known as: BUSPAR  Take 1 tablet (15 mg total) by mouth as needed.   cetirizine  10 MG tablet Commonly known as: ZyrTEC  Allergy Take 1 tablet (10 mg total) by mouth daily.   D3 PO Take by mouth.    magnesium oxide 400 (240 Mg) MG tablet Commonly known as: MAG-OX Take 400 mg by mouth daily.   meclizine 25 MG tablet Commonly known as: ANTIVERT Take 25 mg by mouth 2 (two) times daily as needed.   ondansetron  4 MG tablet Commonly known as: ZOFRAN  Take 1 tablet (4 mg total) by mouth every 8 (eight) hours as needed for nausea or vomiting.   pantoprazole  40 MG tablet Commonly known as: PROTONIX  Take 1 tablet (40 mg total) by mouth daily.   rosuvastatin  5 MG tablet Commonly known as: CRESTOR  Take 1 tablet (5 mg total) by mouth at bedtime.   tirzepatide  7.5 MG/0.5ML Pen Commonly known as: MOUNJARO  Inject 7.5 mg into the skin once a week.   traMADol  50 MG tablet Commonly known as: ULTRAM  Take 1 tablet (50 mg total) by mouth 3 (three) times daily as needed. For pain   valsartan  160 MG tablet Commonly known as: DIOVAN  Take 1 tablet (160 mg total) by mouth daily.   venlafaxine  XR 37.5 MG 24 hr capsule Commonly known as: Effexor  XR Take 1 capsule (37.5 mg total) by mouth daily with breakfast. Started by: Charlies Bellini        All past medical history, surgical history, allergies, family history, immunizations andmedications were updated in the EMR today and reviewed under the history and medication portions of their EMR.      No results found.   ROS 14 pt review of systems performed and negative (unless mentioned in an HPI)  Objective: BP 128/76   Pulse 68  Temp 98.1 F (36.7 C)   Wt 164 lb (74.4 kg)   SpO2 99%   BMI 30.00 kg/m  Physical Exam Vitals and nursing note reviewed.  Constitutional:      General: She is not in acute distress.    Appearance: Normal appearance. She is not ill-appearing, toxic-appearing or diaphoretic.  HENT:     Head: Normocephalic and atraumatic.  Eyes:     General: No scleral icterus.       Right eye: No discharge.        Left eye: No discharge.     Extraocular Movements: Extraocular movements intact.     Conjunctiva/sclera:  Conjunctivae normal.     Pupils: Pupils are equal, round, and reactive to light.  Cardiovascular:     Rate and Rhythm: Normal rate and regular rhythm.     Heart sounds: No murmur heard. Pulmonary:     Effort: Pulmonary effort is normal. No respiratory distress.     Breath sounds: Normal breath sounds. No wheezing, rhonchi or rales.  Musculoskeletal:     Cervical back: Neck supple.     Right lower leg: No edema.     Left lower leg: No edema.  Skin:    General: Skin is warm.     Findings: No rash.  Neurological:     Mental Status: She is alert and oriented to person, place, and time. Mental status is at baseline.     Motor: No weakness.     Gait: Gait normal.  Psychiatric:        Mood and Affect: Mood normal.        Behavior: Behavior normal.        Thought Content: Thought content normal.        Judgment: Judgment normal.      Results for orders placed or performed in visit on 11/24/23 (from the past 48 hours)  POCT glycosylated hemoglobin (Hb A1C)     Status: Abnormal   Collection Time: 11/24/23  3:44 PM  Result Value Ref Range   Hemoglobin A1C 5.2 4.0 - 5.6 %   HbA1c POC (<> result, manual entry) 5.2 4.0 - 5.6 %   HbA1c, POC (prediabetic range) 5.2 (A) 5.7 - 6.4 %   HbA1c, POC (controlled diabetic range) 5.2 0.0 - 7.0 %      Diabetic Foot Exam - Simple   Simple Foot Form Diabetic Foot exam was performed with the following findings: Yes 11/24/2023  3:28 PM  Visual Inspection No deformities, no ulcerations, no other skin breakdown bilaterally: Yes Sensation Testing Intact to touch and monofilament testing bilaterally: Yes Pulse Check Posterior Tibialis and Dorsalis pulse intact bilaterally: Yes Comments     Assessment/plan: SWARA DONZE is a 58 y.o. female present for  chronic condition management Diabetes doing great!  Increase to Mounjaro  7.5 mg weekly injection for (she will start this tomorrow).  Elected to stay on this dose until seen in late September.   Hopefully the Mounjaro  7.5 mg weekly injection will get her fault, which she is very close to obtaining - Hemoglobin A1c 6.6 > 5.3 > 5.5 > 5.2  today PNA series: completed 05/23/2023 Flu shot: Declined (recommneded yearly) GFR-UTD 01/2023 Foot exam: Completed today 11/24/2023 Eye exam: UTD 05/28/2023. Microalbumin collected 05/23/2023  Primary hypertension/hyperlipidemia/family history of heart disease Stable Continue Diovan  160 mg daily. -Routine exercise - Low-sodium diet  Anxiety and depression/hot flashes Discussed decreasing her Wellbutrin  today and adding Effexor  low-dose to help with hot she does, patient  is agreeable to this.  Consider further tapering off Wellbutrin  and increasing Effexor  at next visit. Decrease Wellbutrin  to 150 mg daily Start Effexor  37.5 mg daily Continue BuSpar  15 mg daily as needed  Fibromyalgia/Degeneration of intervertebral disc of lumbar region with discogenic back pain and lower extremity pain Stable Continue baclofen  10 mg twice daily as needed Continue tramadol  50 mg 2-3 times daily as needed [place]  controlled substance database reviewed and appropriate today  Neck pain/neck spasms rash elevated first rib Continue muscle relaxants as needed, Has been  referred to sports med for OMT evaluate and treat  Gastroesophageal reflux disease without esophagitis Stable Continue Protonix  40 mg daily. Monitor B12, vitamin D  (UTD 01/2023) and mag every 2-3 years.   Orders Placed This Encounter  Procedures   POCT glycosylated hemoglobin (Hb A1C)   Meds ordered this encounter  Medications   baclofen  (LIORESAL ) 10 MG tablet    Sig: Take 1 tablet (10 mg total) by mouth 2 (two) times daily.    Dispense:  180 each    Refill:  1   DISCONTD: buPROPion  (WELLBUTRIN  XL) 300 MG 24 hr tablet    Sig: Take 1 tablet (300 mg total) by mouth daily.    Dispense:  90 tablet    Refill:  1   busPIRone  (BUSPAR ) 15 MG tablet    Sig: Take 1 tablet (15 mg total)  by mouth as needed.    Dispense:  90 tablet    Refill:  1   ondansetron  (ZOFRAN ) 4 MG tablet    Sig: Take 1 tablet (4 mg total) by mouth every 8 (eight) hours as needed for nausea or vomiting.    Dispense:  20 tablet    Refill:  1   pantoprazole  (PROTONIX ) 40 MG tablet    Sig: Take 1 tablet (40 mg total) by mouth daily.    Dispense:  90 tablet    Refill:  1   rosuvastatin  (CRESTOR ) 5 MG tablet    Sig: Take 1 tablet (5 mg total) by mouth at bedtime.    Dispense:  90 tablet    Refill:  1   tirzepatide  (MOUNJARO ) 7.5 MG/0.5ML Pen    Sig: Inject 7.5 mg into the skin once a week.    Dispense:  2 mL    Refill:  5   valsartan  (DIOVAN ) 160 MG tablet    Sig: Take 1 tablet (160 mg total) by mouth daily.    Dispense:  90 tablet    Refill:  1   traMADol  (ULTRAM ) 50 MG tablet    Sig: Take 1 tablet (50 mg total) by mouth 3 (three) times daily as needed. For pain    Dispense:  90 tablet    Refill:  5   buPROPion  (WELLBUTRIN  XL) 150 MG 24 hr tablet    Sig: Take 1 tablet (150 mg total) by mouth daily.    Dispense:  90 tablet    Refill:  1   venlafaxine  XR (EFFEXOR  XR) 37.5 MG 24 hr capsule    Sig: Take 1 capsule (37.5 mg total) by mouth daily with breakfast.    Dispense:  90 capsule    Refill:  1   Referral Orders  No referral(s) requested today      Note is dictated utilizing voice recognition software. Although note has been proof read prior to signing, occasional typographical errors still can be missed. If any questions arise, please do not hesitate to call for verification.  Electronically signed by: Charlies  Zeb Rawl, DO [facility]

## 2023-12-28 ENCOUNTER — Telehealth: Payer: Self-pay

## 2023-12-28 NOTE — Telephone Encounter (Signed)
 Communication  Reason for CRM: Patient wanting to know if she is able to go up in tirzepatide  (MOUNJARO ) 7.5 MG/0.5ML Pen, she said she is starting to gain weight   LOV: 11/24/23. Please advise.

## 2023-12-29 NOTE — Telephone Encounter (Signed)
 Recommend patient make a visit to discuss increasing Mounjaro .  Can be virtual.

## 2024-01-12 ENCOUNTER — Telehealth: Admitting: Family Medicine

## 2024-01-12 ENCOUNTER — Encounter: Payer: Self-pay | Admitting: Family Medicine

## 2024-01-12 VITALS — Wt 168.0 lb

## 2024-01-12 DIAGNOSIS — Z7985 Long-term (current) use of injectable non-insulin antidiabetic drugs: Secondary | ICD-10-CM | POA: Diagnosis not present

## 2024-01-12 DIAGNOSIS — E1169 Type 2 diabetes mellitus with other specified complication: Secondary | ICD-10-CM | POA: Diagnosis not present

## 2024-01-12 DIAGNOSIS — E785 Hyperlipidemia, unspecified: Secondary | ICD-10-CM

## 2024-01-12 MED ORDER — TIRZEPATIDE 10 MG/0.5ML ~~LOC~~ SOAJ: 10.0000 mg | SUBCUTANEOUS | 5 refills | Status: DC

## 2024-01-12 NOTE — Progress Notes (Signed)
 Patient ID: Sandy Waters, female  DOB: 05-09-1965, 58 y.o.   MRN: [id number] Patient Care Team    Relationship Specialty Notifications Start End  Catherine Charlies LABOR, DO PCP - General Family Medicine  12/06/22   Harvey Margo CROME, MD (Inactive) Consulting Physician Gastroenterology  05/17/17   May Commander, MD Referring Physician Obstetrics and Gynecology  12/06/22   Joshua Rush, OD  Optometry  12/06/22   Ezzard Sonny RAMAN, PA-C Physician Assistant Gastroenterology  12/06/22    Comment: [facility]    Chief Complaint  Patient presents with   Diabetes    Subjective: Sandy Waters is a 58 y.o.  female present for chronic condition management appointment All past medical history, surgical history, allergies, family history, immunizations, medications and social history were updated in the electronic medical record today. All recent labs, ED visits and hospitalizations within the last year were reviewed.  Diabetes type 2 Pt reports compliance with Mounjaro  7.5 mg weekly and is tolerating taper.  She does report some nausea associated sometimes, but is ready to increase now. Weight did go up 4 lbs. Patient denies dizziness, hyperglycemic or hypoglycemic events. Patient denies numbness, tingling in the extremities or nonhealing wounds of feet.  She reports her goal weight would be about 160 pounds. She has made dietary changes She is exercising     09/11/2023    1:31 PM 05/23/2023    1:07 PM 02/13/2023    1:53 PM 12/06/2022    1:13 PM  Depression screen PHQ 2/9  Decreased Interest 0 0 1 0  Down, Depressed, Hopeless 0 0 0 0  PHQ - 2 Score 0 0 1 0  Altered sleeping 0 0  1  Tired, decreased energy 0 0  1  Change in appetite 0 0  1  Feeling bad or failure about yourself  0 0  0  Trouble concentrating 0 0  0  Moving slowly or fidgety/restless 0 0  0  Suicidal thoughts 0 0  0  PHQ-9 Score 0  0   3   Difficult doing work/chores Not difficult at all Not difficult at all   Not difficult at all     Data saved with a previous flowsheet row definition      09/11/2023    1:31 PM 05/23/2023    1:07 PM 12/06/2022    1:13 PM  GAD 7 : Generalized Anxiety Score  Nervous, Anxious, on Edge 0 0 1  Control/stop worrying 0 0 0  Worry too much - different things 0 0 0  Trouble relaxing 0 0 0  Restless 0 0 0  Easily annoyed or irritable 0 0 0  Afraid - awful might happen 0 0 0  Total GAD 7 Score 0 0 1  Anxiety Difficulty Not difficult at all Not difficult at all Not difficult at all           11/24/2023    3:30 PM 09/11/2023    1:31 PM 05/23/2023    1:07 PM 02/13/2023    1:53 PM 12/06/2022    1:13 PM  Fall Risk   Falls in the past year? 0 0 0 0 0  Number falls in past yr: 0   0 0  Injury with Fall? 0   0 0  Risk for fall due to : No Fall Risks   No Fall Risks No Fall Risks  Follow up Falls evaluation completed;Falls prevention discussed Falls evaluation completed Falls evaluation completed Falls  evaluation completed Falls evaluation completed    Immunization History  Administered Date(s) Administered   Influenza, Seasonal, Injecte, Preservative Fre 01/03/2013   Influenza-Unspecified 12/18/2007   PNEUMOCOCCAL CONJUGATE-20 05/23/2023   Tdap 07/05/2011    No results found.  Past Medical History:  Diagnosis Date   Acid reflux    Anxiety    Arthritis    Carpal tunnel syndrome, left upper limb 09/21/2017   She had a NCV with EMG performed on 04/06/17.  The study revealed a borderline to very mild median nerve entrapment at the left wrist.      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   Depression    Fibromyalgia    2012   Frequent headaches    Hiatal hernia 10/14/2010   History of cholecystectomy 07/10/2014   History of stomach ulcers 09/21/2017   Hypertension    IBS (irritable bowel syndrome) 2019   Migraines 2019   Non-alcoholic fatty liver disease 04/2017   Other insomnia 09/21/2017   Thyroid   disease    Allergies  Allergen Reactions   Macrodantin [Nitrofurantoin Macrocrystal] Swelling    Face swells   Sulfa Antibiotics Swelling    microdantin   Latex Itching and Rash    Band aids   Past Surgical History:  Procedure Laterality Date   CESAREAN SECTION  1986   1986   CHOLECYSTECTOMY  2010   2010   COLONOSCOPY  2014   ?2014 AND REPORTEDLY NORMAL   COLONOSCOPY  05/2006   Dr. Morton D'Oro: few scattered ulcerations in lower rectum likely related to prep, toruous sigmoid colon difficult to pass, few diverticula in sigmoid colon.    ESOPHAGOGASTRODUODENOSCOPY  11/08/2013   Dr. Garlan: normal. biopsies from proximal and distal esophagus with no increased intraepithelial eosinophils, no intestinal metaplasia. gastric bx with chronic active gastritis, no H.pylori   ESOPHAGOGASTRODUODENOSCOPY  09/2010   Dr. Lauraine Denver: bile reflux, small hiatal hernia   ESOPHAGOGASTRODUODENOSCOPY  02/2005   Dr. Morton D'Oro: gastritis mostly in the antrum, superficial ulcerations in the stomach, mild duodenitis, hiatal hernia. gastric bx negative.   SHOULDER SURGERY Right 08/30/2016   bone spur/scar tissue removed    TONSILLECTOMY  1984   1984   Family History  Problem Relation Age of Onset   Heart disease Mother    Hypertension Mother    Diabetes Mother    Breast cancer Mother    Depression Father    Drug abuse Father    Early death Father    Prostate cancer Father        prostate    Hypertension Sister    Hypertension Brother    Schizophrenia Brother    Diabetes Brother    Heart disease Maternal Grandmother    Scoliosis Daughter    Colon cancer Neg Hx    Colon polyps Neg Hx    Social History   Social History Narrative   Not on file    Allergies as of 01/12/2024       Reactions   Macrodantin [nitrofurantoin Macrocrystal] Swelling   Face swells   Sulfa Antibiotics Swelling   microdantin   Latex Itching, Rash   Band aids        Medication List        Accurate as  of January 12, 2024  2:01 PM. If you have any questions, ask your nurse or doctor.          atorvastatin  20 MG tablet  Commonly known as: LIPITOR Take 1 tablet (20 mg total) by mouth at bedtime.   B-12 5000 MCG Caps Take by mouth.   baclofen  10 MG tablet Commonly known as: LIORESAL  Take 1 tablet (10 mg total) by mouth 2 (two) times daily.   buPROPion  150 MG 24 hr tablet Commonly known as: WELLBUTRIN  XL Take 1 tablet (150 mg total) by mouth daily.   busPIRone  15 MG tablet Commonly known as: BUSPAR  Take 1 tablet (15 mg total) by mouth as needed.   cetirizine  10 MG tablet Commonly known as: ZyrTEC  Allergy Take 1 tablet (10 mg total) by mouth daily.   D3 PO Take by mouth.   magnesium oxide 400 (240 Mg) MG tablet Commonly known as: MAG-OX Take 400 mg by mouth daily.   meclizine 25 MG tablet Commonly known as: ANTIVERT Take 25 mg by mouth 2 (two) times daily as needed.   ondansetron  4 MG tablet Commonly known as: ZOFRAN  Take 1 tablet (4 mg total) by mouth every 8 (eight) hours as needed for nausea or vomiting.   pantoprazole  40 MG tablet Commonly known as: PROTONIX  Take 1 tablet (40 mg total) by mouth daily.   rosuvastatin  5 MG tablet Commonly known as: CRESTOR  Take 1 tablet (5 mg total) by mouth at bedtime.   tirzepatide  7.5 MG/0.5ML Pen Commonly known as: MOUNJARO  Inject 7.5 mg into the skin once a week. What changed: Another medication with the same name was added. Make sure you understand how and when to take each. Changed by: Charlies Bellini   tirzepatide  10 MG/0.5ML Pen Commonly known as: MOUNJARO  Inject 10 mg into the skin once a week. What changed: You were already taking a medication with the same name, and this prescription was added. Make sure you understand how and when to take each. Changed by: Charlies Bellini   traMADol  50 MG tablet Commonly known as: ULTRAM  Take 1 tablet (50 mg total) by mouth 3 (three) times daily as needed. For pain   valsartan   160 MG tablet Commonly known as: DIOVAN  Take 1 tablet (160 mg total) by mouth daily.   venlafaxine  XR 37.5 MG 24 hr capsule Commonly known as: Effexor  XR Take 1 capsule (37.5 mg total) by mouth daily with breakfast.        All past medical history, surgical history, allergies, family history, immunizations andmedications were updated in the EMR today and reviewed under the history and medication portions of their EMR.      No results found.   Review of Systems  All other systems reviewed and are negative.  14 pt review of systems performed and negative (unless mentioned in an HPI)  Objective: Wt 168 lb (76.2 kg)   BMI 30.73 kg/m  Physical Exam Vitals and nursing note reviewed.  Constitutional:      General: She is not in acute distress.    Appearance: Normal appearance. She is not toxic-appearing.  HENT:     Head: Normocephalic and atraumatic.  Eyes:     General: No scleral icterus.       Right eye: No discharge.        Left eye: No discharge.     Conjunctiva/sclera: Conjunctivae normal.  Pulmonary:     Effort: Pulmonary effort is normal.  Musculoskeletal:     Cervical back: Normal range of motion.  Skin:    Findings: No rash.  Neurological:     Mental Status: She is alert and oriented to person, place, and time. Mental status is at  baseline.  Psychiatric:        Mood and Affect: Mood normal.        Behavior: Behavior normal.        Thought Content: Thought content normal.        Judgment: Judgment normal.      No results found for this or any previous visit (from the past 48 hours).   Assessment/plan: Sandy Waters is a 58 y.o. female present for  chronic condition management Diabetes Increase to Mounjaro  7.5 mg> 10 mg weekly injection  - Hemoglobin A1c 6.6 > 5.3 > 5.5 > 5.2  PNA series: completed 05/23/2023 Flu shot: Declined (recommneded yearly) GFR-UTD 01/2023 Foot exam: Completed today 11/24/2023 Eye exam: UTD 05/28/2023. Microalbumin  collected 05/23/2023    No orders of the defined types were placed in this encounter.  Meds ordered this encounter  Medications   tirzepatide  (MOUNJARO ) 10 MG/0.5ML Pen    Sig: Inject 10 mg into the skin once a week.    Dispense:  2 mL    Refill:  5   Referral Orders  No referral(s) requested today      Note is dictated utilizing voice recognition software. Although note has been proof read prior to signing, occasional typographical errors still can be missed. If any questions arise, please do not hesitate to call for verification.  Electronically signed by: Charlies Bellini, DO [facility]

## 2024-01-12 NOTE — Patient Instructions (Addendum)
         Great to see you today.  I have refilled the medication(s) we provide.   If labs were collected or images ordered, we will inform you of  results once we have received them and reviewed. We will contact you either by echart message, or telephone call.  Please give ample time to the testing facility, and our office to run,  receive and review results. Please do not call inquiring of results, even if you can see them in your chart. We will contact you as soon as we are able. If it has been over 1 week since the test was completed, and you have not yet heard from us , then please call us .    - echart message- for normal results that have been seen by the patient already.   - telephone call: abnormal results or if patient has not viewed results in their echart.  If a referral to a specialist was entered for you, please call us  in 2 weeks if you have not heard from the specialist office to schedule.

## 2024-02-05 ENCOUNTER — Ambulatory Visit: Admitting: Family Medicine

## 2024-02-05 DIAGNOSIS — M545 Low back pain, unspecified: Secondary | ICD-10-CM

## 2024-02-05 NOTE — Progress Notes (Signed)
Patient rescheduled due to weather.

## 2024-02-05 NOTE — Patient Instructions (Signed)

## 2024-02-07 ENCOUNTER — Ambulatory Visit: Admitting: Family Medicine

## 2024-02-07 ENCOUNTER — Ambulatory Visit (HOSPITAL_BASED_OUTPATIENT_CLINIC_OR_DEPARTMENT_OTHER)
Admission: RE | Admit: 2024-02-07 | Discharge: 2024-02-07 | Disposition: A | Discharge status: Home / Self Care | Source: Ambulatory Visit | Attending: Family Medicine | Admitting: Family Medicine

## 2024-02-07 VITALS — BP 126/78 | HR 63 | Temp 97.9°F | Wt 178.0 lb

## 2024-02-07 DIAGNOSIS — M25551 Pain in right hip: Secondary | ICD-10-CM

## 2024-02-07 DIAGNOSIS — M25552 Pain in left hip: Secondary | ICD-10-CM

## 2024-02-07 DIAGNOSIS — M545 Low back pain, unspecified: Secondary | ICD-10-CM | POA: Insufficient documentation

## 2024-02-07 MED ORDER — METHYLPREDNISOLONE ACETATE 80 MG/ML IJ SUSP
80.0000 mg | Freq: Once | INTRAMUSCULAR | Status: AC
  Administered 2024-02-07: 80 mg via INTRAMUSCULAR

## 2024-02-07 NOTE — Progress Notes (Signed)
 Sandy Waters , 24-Jan-1966, 58 y.o., female MRN: [id number] Patient Care Team    Relationship Specialty Notifications Start End  Catherine Charlies LABOR, DO PCP - General Family Medicine  12/06/22   Harvey Margo CROME, MD (Inactive) Consulting Physician Gastroenterology  05/17/17   May Commander, MD Referring Physician Obstetrics and Gynecology  12/06/22   Joshua Rush, OD  Optometry  12/06/22   Ezzard Sonny RAMAN, PA-C Physician Assistant Gastroenterology  12/06/22    Comment: [facility]    Chief Complaint  Patient presents with   Back Pain    Worsening over the last 3 months. Lower back, spreads down into buttocks. Pt has tried Ibuprofen  and heat compress.      Subjective: Sandy Waters is a 58 y.o. Pt presents for an OV with complaints of greater than 3 months of lower back pain that radiates down bilateral buttocks and upper thighs.  She points to the anterior hip joint as location of discomfort.  Patient reports pain seems to be worse after walking.  She does have a significant history of fibromyalgia, but her fibromyalgia discomfort has never lasted this long. 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 She denies any saddle anesthesia, bladder or bowel dysfunction. MRI lumbar spine 2016 FINDINGS: Normal conus tip at L1.  Normal paraspinal soft tissues. T11-12 through L4-5:  Normal. L5-S1: Small broad-based disc bulge minimally increased since the prior study of 2014. Unilateral pars defect on the right at L5 with secondary degenerative changes at that site. Slight inflammation in the adjacent soft tissues. No neural impingement. No foraminal stenosis. IMPRESSION: 1. Small broad-based bulge of the L5-S1 disc, minimally more prominent than on the prior exam with no neural impingement. 2. 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 3. Otherwise, normal MRI of the lumbar spine.        02/07/2024    2:35 PM  09/11/2023    1:31 PM 05/23/2023    1:07 PM 02/13/2023    1:53 PM 12/06/2022    1:13 PM  Depression screen PHQ 2/9  Decreased Interest 0 0 0 1 0  Down, Depressed, Hopeless 0 0 0 0 0  PHQ - 2 Score 0 0 0 1 0  Altered sleeping 0 0 0  1  Tired, decreased energy 0 0 0  1  Change in appetite 0 0 0  1  Feeling bad or failure about yourself  0 0 0  0  Trouble concentrating 0 0 0  0  Moving slowly or fidgety/restless 0 0 0  0  Suicidal thoughts 0 0 0  0  PHQ-9 Score 0 0  0   3   Difficult doing work/chores Not difficult at all Not difficult at all Not difficult at all  Not difficult at all     Data saved with a previous flowsheet row definition      02/13/2023    1:53 PM 05/23/2023    1:07 PM 09/11/2023    1:31 PM 11/24/2023    3:30 PM 02/07/2024    2:34 PM  Fall Risk  Falls in the past year? 0 0 0 0   Was there an injury with Fall? 0    0  0  Fall Risk Category Calculator 0   0   Patient at Risk for Falls Due to No Fall Risks   No Fall Risks No Fall Risks  Fall risk Follow up  Falls evaluation completed Falls evaluation completed Falls evaluation completed Falls evaluation completed;Falls prevention discussed Falls evaluation completed     Data saved with a previous flowsheet row definition     Allergies  Allergen Reactions   Macrodantin [Nitrofurantoin Macrocrystal] Swelling    Face swells   Sulfa Antibiotics Swelling    microdantin   Latex Itching and Rash    Band aids   Social History   Social History Narrative   Not on file   Past Medical History:  Diagnosis Date   Acid reflux    Anxiety    Arthritis    Carpal tunnel syndrome, left upper limb 09/21/2017   She had a NCV with EMG performed on 04/06/17.  The study revealed a borderline to very mild median nerve entrapment at the left wrist.      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   Depression    Fibromyalgia    2012   Frequent headaches    Hiatal hernia  10/14/2010   History of cholecystectomy 07/10/2014   History of stomach ulcers 09/21/2017   Hypertension    IBS (irritable bowel syndrome) 2019   Migraines 2019   Non-alcoholic fatty liver disease 04/2017   Other insomnia 09/21/2017   Thyroid  disease    Past Surgical History:  Procedure Laterality Date   CESAREAN SECTION  1986   1986   CHOLECYSTECTOMY  2010   2010   COLONOSCOPY  2014   ?2014 AND REPORTEDLY NORMAL   COLONOSCOPY  05/2006   Dr. Morton D'Oro: few scattered ulcerations in lower rectum likely related to prep, toruous sigmoid colon difficult to pass, few diverticula in sigmoid colon.    ESOPHAGOGASTRODUODENOSCOPY  11/08/2013   Dr. Garlan: normal. biopsies from proximal and distal esophagus with no increased intraepithelial eosinophils, no intestinal metaplasia. gastric bx with chronic active gastritis, no H.pylori   ESOPHAGOGASTRODUODENOSCOPY  09/2010   Dr. Lauraine Denver: bile reflux, small hiatal hernia   ESOPHAGOGASTRODUODENOSCOPY  02/2005   Dr. Morton D'Oro: gastritis mostly in the antrum, superficial ulcerations in the stomach, mild duodenitis, hiatal hernia. gastric bx negative.   SHOULDER SURGERY Right 08/30/2016   bone spur/scar tissue removed    TONSILLECTOMY  1984   1984   Family History  Problem Relation Age of Onset   Heart disease Mother    Hypertension Mother    Diabetes Mother    Breast cancer Mother    Depression Father    Drug abuse Father    Early death Father    Prostate cancer Father        prostate    Hypertension Sister    Hypertension Brother    Schizophrenia Brother    Diabetes Brother    Heart disease Maternal Grandmother    Scoliosis Daughter    Colon cancer Neg Hx    Colon polyps Neg Hx    Allergies as of 02/07/2024       Reactions   Macrodantin [nitrofurantoin Macrocrystal] Swelling   Face swells   Sulfa Antibiotics Swelling   microdantin   Latex Itching, Rash   Band aids        Medication List        Accurate as of  February 07, 2024  2:50 PM. If you have any questions, ask your nurse or doctor.          atorvastatin  20 MG tablet Commonly known as: LIPITOR Take 1 tablet (20 mg total)  by mouth at bedtime.   B-12 5000 MCG Caps Take by mouth.   baclofen  10 MG tablet Commonly known as: LIORESAL  Take 1 tablet (10 mg total) by mouth 2 (two) times daily.   buPROPion  150 MG 24 hr tablet Commonly known as: WELLBUTRIN  XL Take 1 tablet (150 mg total) by mouth daily.   busPIRone  15 MG tablet Commonly known as: BUSPAR  Take 1 tablet (15 mg total) by mouth as needed.   cetirizine  10 MG tablet Commonly known as: ZyrTEC  Allergy Take 1 tablet (10 mg total) by mouth daily.   D3 PO Take by mouth.   magnesium oxide 400 (240 Mg) MG tablet Commonly known as: MAG-OX Take 400 mg by mouth daily.   meclizine 25 MG tablet Commonly known as: ANTIVERT Take 25 mg by mouth 2 (two) times daily as needed.   ondansetron  4 MG tablet Commonly known as: ZOFRAN  Take 1 tablet (4 mg total) by mouth every 8 (eight) hours as needed for nausea or vomiting.   pantoprazole  40 MG tablet Commonly known as: PROTONIX  Take 1 tablet (40 mg total) by mouth daily.   rosuvastatin  5 MG tablet Commonly known as: CRESTOR  Take 1 tablet (5 mg total) by mouth at bedtime.   tirzepatide  10 MG/0.5ML Pen Commonly known as: MOUNJARO  Inject 10 mg into the skin once a week.   traMADol  50 MG tablet Commonly known as: ULTRAM  Take 1 tablet (50 mg total) by mouth 3 (three) times daily as needed. For pain   valsartan  160 MG tablet Commonly known as: DIOVAN  Take 1 tablet (160 mg total) by mouth daily.   venlafaxine  XR 37.5 MG 24 hr capsule Commonly known as: Effexor  XR Take 1 capsule (37.5 mg total) by mouth daily with breakfast.        All past medical history, surgical history, allergies, family history, immunizations andmedications were updated in the EMR today and reviewed under the history and medication portions of their  EMR.     ROS Negative, with the exception of above mentioned in HPI   Objective:  BP 126/78   Pulse 63   Temp 97.9 F (36.6 C)   Wt 178 lb (80.7 kg)   SpO2 100%   BMI 32.56 kg/m  Body mass index is 32.56 kg/m. Physical Exam Vitals and nursing note reviewed.  Constitutional:      General: She is not in acute distress.    Appearance: Normal appearance. She is normal weight. She is not ill-appearing or toxic-appearing.  HENT:     Head: Normocephalic and atraumatic.  Eyes:     General: No scleral icterus.       Right eye: No discharge.        Left eye: No discharge.     Extraocular Movements: Extraocular movements intact.     Conjunctiva/sclera: Conjunctivae normal.     Pupils: Pupils are equal, round, and reactive to light.  Musculoskeletal:     Lumbar back: Tenderness and bony tenderness present. No swelling or spasms. Decreased range of motion. Positive left straight leg raise test. Negative right straight leg raise test.     Right hip: Tenderness present. Decreased range of motion.     Left hip: Tenderness present. Decreased range of motion.     Comments: Lumbar spine: No obvious step-off.  Tender to palpation over spinous process L4 and L5. MS BLE 5/5.  Positive SLR left.NV intact distally B/L hips: Decreased range of motion in both internal and external rotation right hip.+FABRE right hip.  Mild discomfort internal rotation left hip. NV intact distally  Skin:    Findings: No rash.  Neurological:     Mental Status: She is alert and oriented to person, place, and time. Mental status is at baseline.     Motor: No weakness.     Coordination: Coordination normal.     Gait: Gait normal.  Psychiatric:        Mood and Affect: Mood normal.        Behavior: Behavior normal.        Thought Content: Thought content normal.        Judgment: Judgment normal.     No results found. No results found. No results found for this or any previous visit (from the past 24  hours).  Assessment/Plan: Sandy Waters is a 58 y.o. female present for OV for  Lumbar pain (Primary) Patient is tender to palpation over spinous process L4-L5 which is concerning for possible stenosis with bilateral symptoms that radiates to the buttocks.  Will start with lumbar x-ray to further evaluate. 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  We discussed starting with x-rays of the hip joints and consider referral to orthopedics or physical therapy depending upon results Consider NSAIDs if able to tolerate after results - DG HIPS BILAT W OR W/O PELVIS 3-4 VIEWS; Future  Reviewed expectations re: course of current medical issues. Discussed self-management of symptoms. Outlined signs and symptoms indicating need for more acute intervention. Patient verbalized understanding and all questions were answered. Patient received an After-Visit Summary.    Orders Placed This Encounter  Procedures   DG Lumbar Spine Complete   DG HIPS BILAT W OR W/O PELVIS 3-4 VIEWS   Meds ordered this encounter  Medications   methylPREDNISolone  acetate (DEPO-MEDROL ) injection 80 mg   Referral Orders  No referral(s) requested today     Note is dictated utilizing voice recognition software. Although note has been proof read prior to signing, occasional typographical errors still can be missed. If any questions arise, please do not hesitate to call for verification.   electronically signed by:  Charlies Bellini, DO  [facility]

## 2024-02-07 NOTE — Patient Instructions (Signed)
 We will call you with xray results once we get them and discuss further plan.            Great to see you today.  I have refilled the medication(s) we provide.   If labs were collected or images ordered, we will inform you of  results once we have received them and reviewed. We will contact you either by echart message, or telephone call.  Please give ample time to the testing facility, and our office to run,  receive and review results. Please do not call inquiring of results, even if you can see them in your chart. We will contact you as soon as we are able. If it has been over 1 week since the test was completed, and you have not yet heard from us , then please call us .    - echart message- for normal results that have been seen by the patient already.   - telephone call: abnormal results or if patient has not viewed results in their echart.  If a referral to a specialist was entered for you, please call us  in 2 weeks if you have not heard from the specialist office to schedule.

## 2024-02-08 ENCOUNTER — Ambulatory Visit: Payer: Self-pay | Admitting: Family Medicine

## 2024-02-08 DIAGNOSIS — M545 Low back pain, unspecified: Secondary | ICD-10-CM

## 2024-02-08 DIAGNOSIS — M25551 Pain in right hip: Secondary | ICD-10-CM

## 2024-02-08 MED ORDER — MELOXICAM 15 MG PO TABS: 15.0000 mg | ORAL_TABLET | Freq: Every day | ORAL | 1 refills | Status: AC

## 2024-02-08 NOTE — Telephone Encounter (Signed)
 Please call patient Lumbar spine and hip x-rays showed no acute fracture or bone lesions. Her spine is well aligned.  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  If the nerve at this level is being irritated from the arthritis, then this could cause her symptoms. All her hip x-ray overall looks good.  She actually has only mild arthritic changes and in the left hip, ironically not on the right where she was having more discomfort on exam.  Recommendations: Start meloxicam  once daily-must be taken with food.  This is a daily anti-inflammatory.  Plan to take for at least 4 weeks, may need longer. Start physical therapy to strengthen her lower back and legs. Follow-up in 6 weeks after starting PT.  If symptoms are not improving we would move forward with more advanced image such as CT or MRI.

## 2024-02-21 ENCOUNTER — Other Ambulatory Visit: Payer: Self-pay | Admitting: Family Medicine

## 2024-02-27 ENCOUNTER — Other Ambulatory Visit (HOSPITAL_COMMUNITY): Payer: Self-pay

## 2024-02-27 ENCOUNTER — Telehealth: Payer: Self-pay

## 2024-02-27 NOTE — Telephone Encounter (Signed)
 Pharmacy Patient Advocate Encounter   Received notification from Onbase that prior authorization for Mounjaro  10 is required/requested.   Insurance verification completed.   The patient is insured through [employer].   Per test claim: Refill too soon. PA is not needed at this time. Medication was filled 02/08/24. Next eligible fill date is 02/29/24.

## 2024-03-07 ENCOUNTER — Telehealth: Payer: Self-pay

## 2024-03-07 MED ORDER — TIRZEPATIDE 10 MG/0.5ML ~~LOC~~ SOAJ: 10.0000 mg | SUBCUTANEOUS | 5 refills | Status: AC

## 2024-03-07 NOTE — Telephone Encounter (Signed)
 Primary Information  Source  Sandy Waters (Patient)   Subject  Sandy Waters (Patient)   Topic  Clinical - Prescription Issue   Voided CRM from Changed Reason for Contact  [id number]    Communication  Reason for CRM: Patient would like her tirzepatide  (MOUNJARO ) 10 MG/0.5ML Pen  Sent to the [facility] per [facility] this medication will be covered at this pharmacy going forward.

## 2024-03-07 NOTE — Telephone Encounter (Signed)
 Pt advised refill re-sent to mail order pharmacy.

## 2024-03-25 ENCOUNTER — Encounter: Admitting: Family Medicine
